# Patient Record
Sex: Male | Born: 1980 | Race: White | Hispanic: No | Smoking: Former smoker
Health system: Southern US, Community
[De-identification: ages and names within clinical notes are randomized; demographics above are authoritative.]

## PROBLEM LIST (undated history)

## (undated) DIAGNOSIS — I10 Essential (primary) hypertension: Secondary | ICD-10-CM

## (undated) DIAGNOSIS — F419 Anxiety disorder, unspecified: Secondary | ICD-10-CM

## (undated) DIAGNOSIS — M25569 Pain in unspecified knee: Secondary | ICD-10-CM

## (undated) DIAGNOSIS — M549 Dorsalgia, unspecified: Secondary | ICD-10-CM

## (undated) HISTORY — PX: KNEE SURGERY: SHX244

## (undated) HISTORY — PX: BACK SURGERY: SHX140

---

## 2020-09-15 ENCOUNTER — Other Ambulatory Visit: Payer: Self-pay

## 2020-09-15 ENCOUNTER — Ambulatory Visit (HOSPITAL_COMMUNITY)
Admission: EM | Admit: 2020-09-15 | Discharge: 2020-09-15 | Disposition: A | Discharge status: Home / Self Care | Payer: BC Managed Care – PPO

## 2020-09-15 ENCOUNTER — Encounter (HOSPITAL_COMMUNITY): Payer: Self-pay | Admitting: Emergency Medicine

## 2020-09-15 DIAGNOSIS — G8929 Other chronic pain: Secondary | ICD-10-CM | POA: Diagnosis not present

## 2020-09-15 DIAGNOSIS — M25562 Pain in left knee: Secondary | ICD-10-CM | POA: Diagnosis not present

## 2020-09-15 HISTORY — DX: Essential (primary) hypertension: I10

## 2020-09-15 HISTORY — DX: Dorsalgia, unspecified: M54.9

## 2020-09-15 HISTORY — DX: Pain in unspecified knee: M25.569

## 2020-09-15 MED ORDER — PREDNISONE 10 MG PO TABS: 20.0000 mg | ORAL_TABLET | Freq: Every day | ORAL | 0 refills | Status: AC

## 2020-09-15 NOTE — Discharge Instructions (Signed)
Take the prednisone as prescribed.  [facility]. Work note given

## 2020-09-15 NOTE — ED Triage Notes (Signed)
Left knee issues for a long time.  Monday was doing a lot of work on a ladder.  Since then has had pain.  No-specific injury.    Work has requested he get seen before returning to work.

## 2020-09-19 NOTE — ED Provider Notes (Signed)
[facility]    CSN: [id number] Arrival date & time: 09/15/20  7106      History   Chief Complaint Chief Complaint  Patient presents with  . Knee Pain    HPI Dillon Gonzalez is a 39 y.o. male.   Patient is a 39 year old presents today with left knee pain.  This is chronic for him.  Has known meniscus issues in that knee and knee surgery.  Monday was doing a lot of work on a ladder and since has had pain.  No specific injury.  Has had mild swelling.  Here for note to return to work.  Has been taping the knee, resting, icing.     Past Medical History:  Diagnosis Date  . Back pain   . Hypertension   . Knee pain     There are no problems to display for this patient.   Past Surgical History:  Procedure Laterality Date  . KNEE SURGERY         Home Medications    Prior to Admission medications   Medication Sig Start Date End Date Taking? Authorizing Provider  cyclobenzaprine (FLEXERIL) 10 MG tablet 1 tablet    [provider]    Family History No family history on file.  Social History Social History   Tobacco Use  . Smoking status: Former Smoker  Substance Use Topics  . Alcohol use: Yes  . Drug use: Never     Allergies   Patient has no known allergies.   Review of Systems Review of Systems   Physical Exam Triage Vital Signs ED Triage Vitals  Enc Vitals Group     BP 09/15/20 1134 (!) 158/95     Pulse Rate 09/15/20 1134 68     Resp 09/15/20 1134 20     Temp 09/15/20 1134 98.8 F (37.1 C)     Temp Source 09/15/20 1134 Oral     SpO2 09/15/20 1134 100 %     Weight --      Height --      Head Circumference --      Peak Flow --      Pain Score 09/15/20 1128 2     Pain Loc --      Pain Edu? --      Excl. in GC? --    No data found.  Updated Vital Signs BP (!) 158/95 (BP Location: Right Arm)   Pulse 68   Temp 98.8 F (37.1 C) (Oral)   Resp 20   SpO2 100%   Visual Acuity Right Eye Distance:   Left Eye Distance:    Bilateral Distance:    Right Eye Near:   Left Eye Near:    Bilateral Near:     Physical Exam Vitals and nursing note reviewed.  Constitutional:      Appearance: Normal appearance.  HENT:     Head: Normocephalic and atraumatic.     Nose: Nose normal.  Eyes:     Conjunctiva/sclera: Conjunctivae normal.  Pulmonary:     Effort: Pulmonary effort is normal.  Musculoskeletal:        General: Normal range of motion.     Cervical back: Normal range of motion.  Skin:    General: Skin is warm and dry.  Neurological:     Mental Status: He is alert.  Psychiatric:        Mood and Affect: Mood normal.      UC Treatments / Results  Labs (all labs ordered  are listed, but only abnormal results are displayed) Labs Reviewed - No data to display  EKG   Radiology No results found.  Procedures Procedures (including critical care time)  Medications Ordered in UC Medications - No data to display  Initial Impression / Assessment and Plan / UC Course  I have reviewed the triage vital signs and the nursing notes.  Pertinent labs & imaging results that were available during my care of the patient were reviewed by me and considered in my medical decision making (see chart for details).     Chronic pain of left knee Patient having improvement of symptoms.  We will do low-dose prednisone x3 days. Note to go back to work [facility]  Final Clinical Impressions(s) / UC Diagnoses   Final diagnoses:  Chronic pain of left knee     Discharge Instructions     Take the prednisone as prescribed.  [facility]. Work note given    ED Prescriptions    Medication Sig Dispense Auth. Provider   predniSONE (DELTASONE) 10 MG tablet Take 2 tablets (20 mg total) by mouth daily for 3 days. 6 tablet Dahlia Byes A, NP     PDMP not reviewed this encounter.   Dahlia Byes A, NP 09/19/20 [phone]

## 2020-10-28 ENCOUNTER — Encounter (HOSPITAL_COMMUNITY): Payer: Self-pay

## 2020-10-28 ENCOUNTER — Ambulatory Visit (HOSPITAL_COMMUNITY)
Admission: EM | Admit: 2020-10-28 | Discharge: 2020-10-28 | Disposition: A | Discharge status: Home / Self Care | Payer: BC Managed Care – PPO | Attending: Family Medicine | Admitting: Family Medicine

## 2020-10-28 DIAGNOSIS — G8929 Other chronic pain: Secondary | ICD-10-CM

## 2020-10-28 DIAGNOSIS — M545 Low back pain, unspecified: Secondary | ICD-10-CM

## 2020-10-28 HISTORY — DX: Anxiety disorder, unspecified: F41.9

## 2020-10-28 MED ORDER — TIZANIDINE HCL 4 MG PO TABS: 4.0000 mg | ORAL_TABLET | Freq: Four times a day (QID) | ORAL | 0 refills | Status: DC | PRN

## 2020-10-28 NOTE — ED Provider Notes (Signed)
[facility]    CSN: [id number] Arrival date & time: 10/28/20  0849      History   Chief Complaint Chief Complaint  Patient presents with  . Back Pain    HPI Dillon Gonzalez is a 39 y.o. male presenting today for evaluation of back pain.  Patient reports that he has chronic lower back pain which she has dealt with since 2006.  Over the past few days his back pain has been flared up more than normal and has been unable to go to work.  Has slightly more burning/warmth sensation to lower back.  Denies urinary symptoms.  Denies weakness in legs.  Has a physical job, but feels his back pain is improved enough to return to work.  HPI  Past Medical History:  Diagnosis Date  . Anxiety   . Back pain   . Hypertension   . Knee pain     There are no problems to display for this patient.   Past Surgical History:  Procedure Laterality Date  . BACK SURGERY    . KNEE SURGERY         Home Medications    Prior to Admission medications   Medication Sig Start Date End Date Taking? Authorizing Provider  cyclobenzaprine (FLEXERIL) 10 MG tablet 1 tablet   Yes [provider]  lisinopril (ZESTRIL) 10 MG tablet Take 10 mg by mouth daily.   Yes [provider]  tiZANidine (ZANAFLEX) 4 MG tablet Take 1 tablet (4 mg total) by mouth every 6 (six) hours as needed for muscle spasms. 10/28/20   Carleena Mires, Junius Creamer, PA-C    Family History History reviewed. No pertinent family history.  Social History Social History   Tobacco Use  . Smoking status: Former Smoker  Substance Use Topics  . Alcohol use: Yes  . Drug use: Never     Allergies   Patient has no known allergies.   Review of Systems Review of Systems  Constitutional: Negative for fatigue and fever.  Eyes: Negative for redness, itching and visual disturbance.  Respiratory: Negative for shortness of breath.   Cardiovascular: Negative for chest pain and leg swelling.  Gastrointestinal: Negative for  nausea and vomiting.  Musculoskeletal: Positive for back pain and myalgias. Negative for arthralgias.  Skin: Negative for color change, rash and wound.  Neurological: Negative for dizziness, syncope, weakness, light-headedness and headaches.     Physical Exam Triage Vital Signs ED Triage Vitals  Enc Vitals Group     BP 10/28/20 1009 (!) 193/107     Pulse Rate 10/28/20 1009 88     Resp 10/28/20 1009 18     Temp 10/28/20 1009 98.6 F (37 C)     Temp Source 10/28/20 1009 Oral     SpO2 10/28/20 1009 98 %     Weight --      Height --      Head Circumference --      Peak Flow --      Pain Score 10/28/20 1005 4     Pain Loc --      Pain Edu? --      Excl. in GC? --    No data found.  Updated Vital Signs BP (!) 193/107 (BP Location: Right Arm)   Pulse 88   Temp 98.6 F (37 C) (Oral)   Resp 18   SpO2 98%   Visual Acuity Right Eye Distance:   Left Eye Distance:   Bilateral Distance:    Right Eye  Near:   Left Eye Near:    Bilateral Near:     Physical Exam Vitals and nursing note reviewed.  Constitutional:      Appearance: He is well-developed.     Comments: No acute distress  HENT:     Head: Normocephalic and atraumatic.     Nose: Nose normal.  Eyes:     Conjunctiva/sclera: Conjunctivae normal.  Cardiovascular:     Rate and Rhythm: Normal rate.  Pulmonary:     Effort: Pulmonary effort is normal. No respiratory distress.  Abdominal:     General: There is no distension.  Musculoskeletal:        General: Normal range of motion.     Cervical back: Neck supple.     Comments: Diffuse tenderness throughout lumbar spine midline and bilateral lumbar musculature extending into upper gluteal areas Strength at hips and knees 5/5 and equal bilaterally; patellar reflex 2+  Skin:    General: Skin is warm and dry.  Neurological:     Mental Status: He is alert and oriented to person, place, and time.      UC Treatments / Results  Labs (all labs ordered are listed, but  only abnormal results are displayed) Labs Reviewed - No data to display  EKG   Radiology No results found.  Procedures Procedures (including critical care time)  Medications Ordered in UC Medications - No data to display  Initial Impression / Assessment and Plan / UC Course  I have reviewed the triage vital signs and the nursing notes.  Pertinent labs & imaging results that were available during my care of the patient were reviewed by me and considered in my medical decision making (see chart for details).    Acute on chronic back pain: Has history of gastritis, prefers to avoid NSAIDs.  Did not like side effect profile from previous prednisone course.  Will stick to muscle relaxers, will provide tizanidine as alternative to Flexeril.  Has plans to follow-up with spine specialist for further treatment.  No red flags, no signs of cauda equina.  Discussed strict return precautions. Patient verbalized understanding and is agreeable with plan.  Final Clinical Impressions(s) / UC Diagnoses   Final diagnoses:  Chronic bilateral low back pain without sciatica     Discharge Instructions     May try tizanidine as alternative Follow up for any concerns    ED Prescriptions    Medication Sig Dispense Auth. Provider   tiZANidine (ZANAFLEX) 4 MG tablet Take 1 tablet (4 mg total) by mouth every 6 (six) hours as needed for muscle spasms. 30 tablet Almarie Kurdziel, [place] C, PA-C     PDMP not reviewed this encounter.   Blakeley Margraf, Jackson C, PA-C 10/28/20 1037

## 2020-10-28 NOTE — ED Triage Notes (Signed)
Pt c/o lumbar back pain and requests work excuse for same stating that he can return to work on October 30. States he was out of work on Tuesday and Wednesday. States pain is better today.   Pt states he has a long h/o back pain.  Reports he had RFA tx for back pain in August. Has been tapering off an SSRI.  Pt states he had CT of back and was told his "colon was inflamed". Pt was able to stretch with left arm out to side to retrieve paper from counter while sitting on exam table.

## 2020-10-28 NOTE — Discharge Instructions (Signed)
May try tizanidine as alternative Follow up for any concerns

## 2021-07-05 ENCOUNTER — Other Ambulatory Visit (HOSPITAL_COMMUNITY): Payer: Self-pay | Admitting: Rehabilitation

## 2021-07-05 DIAGNOSIS — M47819 Spondylosis without myelopathy or radiculopathy, site unspecified: Secondary | ICD-10-CM

## 2021-07-17 ENCOUNTER — Encounter (HOSPITAL_COMMUNITY)
Admission: RE | Admit: 2021-07-17 | Discharge: 2021-07-17 | Disposition: A | Discharge status: Home / Self Care | Payer: No Typology Code available for payment source | Source: Ambulatory Visit | Attending: Rehabilitation | Admitting: Rehabilitation

## 2021-07-17 ENCOUNTER — Other Ambulatory Visit: Payer: Self-pay

## 2021-07-17 ENCOUNTER — Ambulatory Visit (HOSPITAL_COMMUNITY)
Admission: RE | Admit: 2021-07-17 | Discharge: 2021-07-17 | Disposition: A | Discharge status: Home / Self Care | Payer: No Typology Code available for payment source | Source: Ambulatory Visit | Attending: Rehabilitation | Admitting: Rehabilitation

## 2021-07-17 DIAGNOSIS — M47819 Spondylosis without myelopathy or radiculopathy, site unspecified: Secondary | ICD-10-CM | POA: Insufficient documentation

## 2021-07-17 MED ORDER — TECHNETIUM TC 99M MEDRONATE IV KIT
20.0000 | PACK | Freq: Once | INTRAVENOUS | Status: AC | PRN
  Administered 2021-07-17: 19.2 via INTRAVENOUS

## 2022-01-22 DIAGNOSIS — R911 Solitary pulmonary nodule: Secondary | ICD-10-CM | POA: Insufficient documentation

## 2022-01-29 ENCOUNTER — Emergency Department (HOSPITAL_COMMUNITY): Payer: No Typology Code available for payment source

## 2022-01-29 ENCOUNTER — Emergency Department (HOSPITAL_COMMUNITY)
Admission: EM | Admit: 2022-01-29 | Discharge: 2022-01-29 | Disposition: A | Discharge status: Home / Self Care | Payer: No Typology Code available for payment source | Attending: Emergency Medicine | Admitting: Emergency Medicine

## 2022-01-29 ENCOUNTER — Encounter (HOSPITAL_COMMUNITY): Payer: Self-pay | Admitting: Emergency Medicine

## 2022-01-29 ENCOUNTER — Other Ambulatory Visit: Payer: Self-pay

## 2022-01-29 DIAGNOSIS — R569 Unspecified convulsions: Secondary | ICD-10-CM | POA: Diagnosis present

## 2022-01-29 LAB — RAPID URINE DRUG SCREEN, HOSP PERFORMED
Amphetamines: NOT DETECTED
Barbiturates: NOT DETECTED
Benzodiazepines: NOT DETECTED
Cocaine: NOT DETECTED
Opiates: NOT DETECTED
Tetrahydrocannabinol: NOT DETECTED

## 2022-01-29 LAB — BASIC METABOLIC PANEL
Anion gap: 12 (ref 5–15)
BUN: 6 mg/dL (ref 6–20)
CO2: 22 mmol/L (ref 22–32)
Calcium: 8.6 mg/dL — ABNORMAL LOW (ref 8.9–10.3)
Chloride: 99 mmol/L (ref 98–111)
Creatinine, Ser: 0.8 mg/dL (ref 0.61–1.24)
GFR, Estimated: >60 mL/min (ref >60)
Glucose, Bld: 105 mg/dL — ABNORMAL HIGH (ref 70–99)
Potassium: 3.6 mmol/L (ref 3.5–5.1)
Sodium: 133 mmol/L — ABNORMAL LOW (ref 135–145)

## 2022-01-29 LAB — CBC
HCT: 38.4 % — ABNORMAL LOW (ref 39.0–52.0)
Hemoglobin: 13.3 g/dL (ref 13.0–17.0)
MCH: 33.8 pg (ref 26.0–34.0)
MCHC: 34.6 g/dL (ref 30.0–36.0)
MCV: 97.7 fL (ref 80.0–100.0)
Platelets: 142 10*3/uL — ABNORMAL LOW (ref 150–400)
RBC: 3.93 MIL/uL — ABNORMAL LOW (ref 4.22–5.81)
RDW: 12.3 % (ref 11.5–15.5)
WBC: 6.2 10*3/uL (ref 4.0–10.5)
nRBC: 0.3 % — ABNORMAL HIGH (ref 0.0–0.2)

## 2022-01-29 LAB — ETHANOL: Alcohol, Ethyl (B): <10 mg/dL (ref <10)

## 2022-01-29 MED ORDER — LORAZEPAM 1 MG PO TABS: 1.0000 mg | ORAL_TABLET | Freq: Three times a day (TID) | ORAL | 0 refills | Status: DC | PRN

## 2022-01-29 MED ORDER — LORAZEPAM 1 MG PO TABS
1.0000 mg | ORAL_TABLET | Freq: Once | ORAL | Status: AC
  Administered 2022-01-29: 1 mg via ORAL
  Filled 2022-01-29: qty 1

## 2022-01-29 NOTE — ED Notes (Signed)
Patient transported to CT 

## 2022-01-29 NOTE — Discharge Instructions (Addendum)
Called one of the neurology offices for the new seizure.  Do not drive until they clear you.  Or you can follow with the [place]
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
## 2022-01-29 NOTE — ED Triage Notes (Signed)
Pt here after witnessed seizure activity x 2 today. Friend reports pt was agitated and confused initially after event, pt bit his tongue, no hx seizure. Pt reports he drinks regularly, last drink last night.
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
## 2022-02-16 ENCOUNTER — Ambulatory Visit: Payer: No Typology Code available for payment source | Admitting: Neurology

## 2022-02-16 DIAGNOSIS — Z029 Encounter for administrative examinations, unspecified: Secondary | ICD-10-CM

## 2022-02-19 ENCOUNTER — Encounter: Payer: Self-pay | Admitting: Neurology

## 2022-02-21 ENCOUNTER — Encounter: Payer: Self-pay | Admitting: Neurology

## 2022-02-21 ENCOUNTER — Ambulatory Visit (INDEPENDENT_AMBULATORY_CARE_PROVIDER_SITE_OTHER): Payer: No Typology Code available for payment source | Admitting: Neurology

## 2022-02-21 ENCOUNTER — Other Ambulatory Visit: Payer: Self-pay

## 2022-02-21 VITALS — BP 158/93 | HR 96 | Ht 75.0 in | Wt 198.8 lb

## 2022-02-21 DIAGNOSIS — R569 Unspecified convulsions: Secondary | ICD-10-CM | POA: Diagnosis not present
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
## 2022-02-21 NOTE — Progress Notes (Signed)
NEUROLOGY CONSULTATION NOTE  Dillon Gonzalez MRN: [id number] DOB: May 10, 1981  Referring provider: Dr. Charlyne Petrin Primary care provider: Dr. Charlyne Petrin  Reason for consult:  new onset seizure   Thank you for your kind referral of Dillon Gonzalez for consultation of the above symptoms. Although his history is well known to you, please allow me to reiterate it for the purpose of our medical record. He is alone in the office today. Records and images were personally reviewed where available.   HISTORY OF PRESENT ILLNESS: This is a 41 year old right-handed man with a history of hypertension, anxiety, PTSD, presenting for evaluation of new onset seizure that occurred on 01/29/2022. He was a passenger in the car and recalls looking through his phone then feeling like his hands stopped working. He recalls repeatedly saying "no," then woke up with EMS around him. His calves felt like he ran 3 miles and he felt maybe a little weaker on the left side. His friend reported that his arms began shaking, then he slumped down and was unresponsive for several minutes followed by another bout of shaking, gasping, "spitting everywhere." He had bitten his tongue. He was brought to the ER where CBC, BMP were unremarkable, negative UDS and EtOH level. I personally reviewed head CT without contrast which was normal.   He denies any prior history of convulsions but would occasionally have gaps in time. He does not remember eating something or how he got to the bedroom. He denies being told of any staring episodes. He lives alone, his friend stays sometimes and has told him that "really weird stuff happen in sleep." He has OSA and uses a CPAP machine, he would stop breathing, gasp an shake a little bit. There was an incident in March 2022 where he got out of bed, took 2 steps and fell, went to the kitchen and fell again. He wonders if this is due to medication, he was dizzy but awake enough and denies losing  consciousness.He occasionally notices a metallic taste. His left side has been weaker for a long time from a back injury causing nerve damage. Since the beginning of January, he has noticed more numbness in his feet, he can walk but can barely feel anything. This did improve after he stopped Telmisartan but there is still tingling L>R. He has sleep difficulties and was a little sleep deprived prior to the seizure. He started Trazodone 3 night ago and now gets 4-5 hours of sleep. He has Tramadol for back pain but reports last intake prior to the seizure was 3 weeks before. He denies daily alcohol use. His last LFTs on 01/10/22 were AST 134, ALT 237. He is currently unemployed. He feels his memory is good. He had a normal birth and early development.  There is no history of febrile convulsions, CNS infections such as meningitis/encephalitis, significant traumatic brain injury, neurosurgical procedures, or family history of seizures.    PAST MEDICAL HISTORY: Past Medical History:  Diagnosis Date   Anxiety    Back pain    Hypertension    Knee pain     PAST SURGICAL HISTORY: Past Surgical History:  Procedure Laterality Date   BACK SURGERY     KNEE SURGERY      MEDICATIONS: Current Outpatient Medications on File Prior to Visit  Medication Sig Dispense Refill   celecoxib (CELEBREX) 200 MG capsule 1 capsule with food Orally Once a day for 30 day(s)     Cholecalciferol 25 MCG (1000 UT) tablet  Take by mouth.     cyclobenzaprine (FLEXERIL) 10 MG tablet Takes half tablet     LORazepam (ATIVAN) 1 MG tablet Take 1 tablet (1 mg total) by mouth every 8 (eight) hours as needed for anxiety or seizure. 5 tablet 0   omeprazole (PRILOSEC) 20 MG capsule 1 capsule 30 minutes before morning meal Orally Once a day for 30 day(s)     traZODone (DESYREL) 50 MG tablet Take by mouth.     tiZANidine (ZANAFLEX) 4 MG tablet Take 1 tablet (4 mg total) by mouth every 6 (six) hours as needed for muscle spasms. (Patient not  taking: Reported on 02/21/2022) 30 tablet 0   No current facility-administered medications on file prior to visit.    ALLERGIES: Allergies  Allergen Reactions   Amlodipine     Other reaction(s): Lethargy (intolerance)   Lisinopril Nausea And Vomiting   Losartan    Paroxetine Diarrhea   Sertraline Nausea And Vomiting   Telmisartan Swelling    FAMILY HISTORY: History reviewed. No pertinent family history.  SOCIAL HISTORY: Social History   Socioeconomic History   Marital status: Single    Spouse name: Not on file   Number of children: Not on file   Years of education: Not on file   Highest education level: Not on file  Occupational History   Not on file  Tobacco Use   Smoking status: Former   Smokeless tobacco: Not on file  Vaping Use   Vaping Use: Former  Substance and Sexual Activity   Alcohol use: Yes   Drug use: Never   Sexual activity: Not on file  Other Topics Concern   Not on file  Social History Narrative   Right handed    Social Determinants of Health   Financial Resource Strain: Not on file  Food Insecurity: Not on file  Transportation Needs: Not on file  Physical Activity: Not on file  Stress: Not on file  Social Connections: Not on file  Intimate Partner Violence: Not on file     PHYSICAL EXAM: Vitals:   02/21/22 1256  BP: (!) 158/93  Pulse: 96  SpO2: 98%   General: No acute distress Head:  Normocephalic/atraumatic Skin/Extremities: No rash, no edema Neurological Exam: Mental status: alert and oriented to person, place, and time, no dysarthria or aphasia, Fund of knowledge is appropriate.  Recent and remote memory are intact, 3/3 delayed recall.  Attention and concentration are normal, 5/5 WORLD backwards.  Cranial nerves: CN I: not tested CN II: pupils equal, round and reactive to light, visual fields intact CN III, IV, VI:  full range of motion, no nystagmus, no ptosis CN V: facial sensation intact CN VII: upper and lower face  symmetric CN VIII: hearing intact to conversation Bulk & Tone: normal, no fasciculations. Motor: 5/5 throughout with no pronator drift. Sensation: intact to light touch, cold, pin, vibration and joint position sense.  No extinction to double simultaneous stimulation.  Romberg test negative Deep Tendon Reflexes: +1 on both LE, brisk +2 on both LE. Negative Hoffman sign, no ankle clonus Cerebellar: no incoordination on finger to nose testing Gait: narrow-based and steady, able to tandem walk adequately. Tremor: no resting tremor. He has bilateral postural and endpoint hand tremors   IMPRESSION: This is a 41 year old right-handed man with a history of hypertension, anxiety, PTSD, presenting for evaluation of new onset seizure that occurred on 01/29/2022. His neurological exam is normal. We discussed that after an initial seizure, unless there are significant  risk factors, an abnormal neurological exam, an EEG showing epileptiform abnormalities, and/or abnormal neuroimaging, treatment with an antiepileptic drug is not indicated. MRI brain with and without contrast and a 1-hour EEG will be ordered. We discussed 10% of the population may have a single seizure. Patients with a single unprovoked seizure have a recurrence rate of 33%  after a single seizure and 73% after a second seizure. He was advised to continue with reduction in alcohol intake. We discussed [place] driving restrictions which indicate a patient needs to free of seizures or events of altered awareness for 6 months prior to resuming driving. The patient agreed to comply with these restrictions.  Follow-up in 3 months,call for any changes.    Thank you for allowing me to participate in the care of this patient. Please do not hesitate to call for any questions or concerns.   Ellouise Newer, M.D.  CC: Dr. Domenica Fail

## 2022-03-06 ENCOUNTER — Ambulatory Visit: Payer: No Typology Code available for payment source | Admitting: Neurology

## 2022-03-06 ENCOUNTER — Other Ambulatory Visit: Payer: Self-pay

## 2022-03-06 DIAGNOSIS — R569 Unspecified convulsions: Secondary | ICD-10-CM

## 2022-03-06 NOTE — Progress Notes (Signed)
Earlier events noted. Patient strongly advised ER evaluation which he declined and left against medical advise. We will reschedule EEG if he wishes.  ?

## 2022-03-06 NOTE — Progress Notes (Signed)
Pt came in today to get an EEG and started having active chest pain, this nurse Herbert Seta was called to check his VS his BP 170/110 heart rate 98 oxygen 98%. Pt complaint of chest pain, headache, and pain moving to arm and all over uncomfortable pain. Pt was advised that we needed to call 911 he became agitated and stated he wanted to do his test and leave. Pt was moving around in the bed with signs of discomfort and still refusing to go the ER [facility]. Pt stated he took an uber here and did not have a driver. Pt told EEG tech that he did drive pt is a seizure pt and should not be driving because he is not 6 months seizure free,  When asking pt about his pain he started yelling unhook me I want to leave. Darl Pikes EEG tech unhooked the pt, Dr Karel Jarvis was informed of pt VS and how he was acting. Pt was unhooked from the EEG testing and walked out of the office still refusing his care,  ?

## 2022-03-13 ENCOUNTER — Other Ambulatory Visit: Payer: Self-pay

## 2022-03-13 ENCOUNTER — Ambulatory Visit
Admission: RE | Admit: 2022-03-13 | Discharge: 2022-03-13 | Disposition: A | Discharge status: Home / Self Care | Payer: No Typology Code available for payment source | Source: Ambulatory Visit | Attending: Neurology | Admitting: Neurology

## 2022-03-13 DIAGNOSIS — R569 Unspecified convulsions: Secondary | ICD-10-CM

## 2022-03-13 MED ORDER — GADOBENATE DIMEGLUMINE 529 MG/ML IV SOLN
18.0000 mL | Freq: Once | INTRAVENOUS | Status: AC | PRN
  Administered 2022-03-13: 18 mL via INTRAVENOUS

## 2022-03-16 ENCOUNTER — Telehealth: Payer: Self-pay

## 2022-03-16 NOTE — Telephone Encounter (Signed)
Pt called per DPR left a voice mail that brain MRI is normal, no tumor, stroke, or bleed. When he is ready for EEG and would like [place] to do it, can r/s with front staff. Office to the number was left if pt has any questions or concerns  ?

## 2022-03-16 NOTE — Telephone Encounter (Signed)
-----   Message from Van Clines, MD sent at 03/15/2022 11:15 PM EDT ----- ?Pls let him know brain MRI is normal, no tumor, stroke, or bleed. When he is ready for EEG and would like [place] to do it, can r/s with front staff.  ?

## 2022-03-29 ENCOUNTER — Ambulatory Visit (INDEPENDENT_AMBULATORY_CARE_PROVIDER_SITE_OTHER): Payer: No Typology Code available for payment source | Admitting: Neurology

## 2022-03-29 DIAGNOSIS — R569 Unspecified convulsions: Secondary | ICD-10-CM

## 2022-04-02 ENCOUNTER — Other Ambulatory Visit: Payer: No Typology Code available for payment source

## 2022-04-03 ENCOUNTER — Telehealth: Payer: Self-pay

## 2022-04-03 NOTE — Telephone Encounter (Signed)
Pt called no answer left a voice mail that eeg was normal  ?

## 2022-04-03 NOTE — Procedures (Signed)
ELECTROENCEPHALOGRAM REPORT ? ?Date of Study: 03/29/2022 ? ?Patient's Name: Dillon Gonzalez ?MRN: [id number] ?Date of Birth: 04/11/1981 ? ?Referring Provider: Dr. Ellouise Newer ? ?Clinical History: This is a 41 year old man with new onset seizure in January 2023. EEG for classification. ? ?Medications: ?CELEBREX 200 MG capsule ?Cholecalciferol 25 MCG (1000 UT) tablet ?FLEXERIL 10 MG tablet ?ATIVAN 1 MG tablet ?PRILOSEC 20 MG capsule ?DESYREL 50 MG tablet ?ZANAFLEX 4MG  tablet ? ?Technical Summary: ?A multichannel digital 1-hour EEG recording measured by the international 10-20 system with electrodes applied with paste and impedances below 5000 ohms performed in our laboratory with EKG monitoring in an awake and asleep patient.  Hyperventilation and photic stimulation was performed.  The digital EEG was referentially recorded, reformatted, and digitally filtered in a variety of bipolar and referential montages for optimal display.   ? ?Description: ?The patient is awake and drowsy during the recording.  During maximal wakefulness, there is a symmetric, medium voltage 10 Hz posterior dominant rhythm that attenuates with eye opening.  The record is symmetric.  During drowsiness, there is an increase in theta slowing of the background.  Sleep was not captured. Hyperventilation and photic stimulation did not elicit any abnormalities.  There were no epileptiform discharges or electrographic seizures seen.   ? ?EKG lead was unremarkable. ? ?Impression: ?This 1-hour awake and drowsy EEG is normal.   ? ?Clinical Correlation: ?A normal EEG does not exclude a clinical diagnosis of epilepsy.  If further clinical questions remain, prolonged EEG may be helpful.  Clinical correlation is advised. ? ? ?Ellouise Newer, M.D. ? ?

## 2022-04-03 NOTE — Telephone Encounter (Signed)
-----   Message from Van Clines, MD sent at 04/03/2022  1:49 PM EDT ----- ?Pls let him know EEG was normal, thanks ?

## 2022-04-05 ENCOUNTER — Other Ambulatory Visit: Payer: No Typology Code available for payment source

## 2022-05-25 ENCOUNTER — Ambulatory Visit: Payer: No Typology Code available for payment source | Admitting: Neurology

## 2023-04-13 IMAGING — CT CT HEAD W/O CM
4 series · 17 of 47 positions shown, 19 images · non-contrast
Comparison: None.

CLINICAL DATA: New onset seizure activity



[Series 3: head without · axial · non-contrast · 0.42mm/px · z∈[+130,+260]mm · 7 of 36 slices shown, 9 images]
[im 5/36  brain]
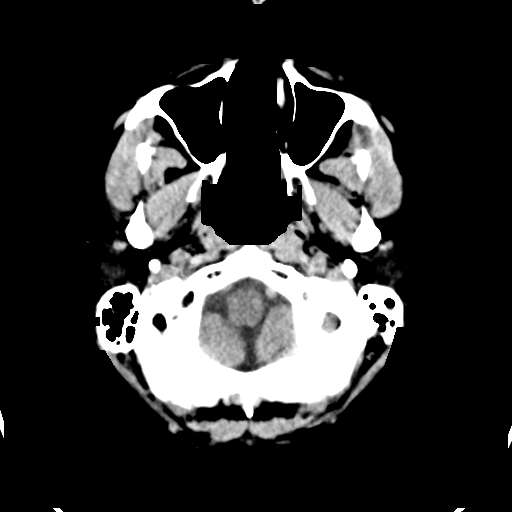
[im 5/36  bone]
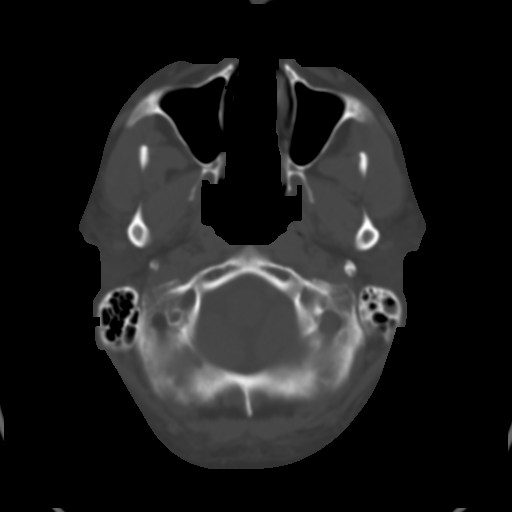
[im 9/36  brain]
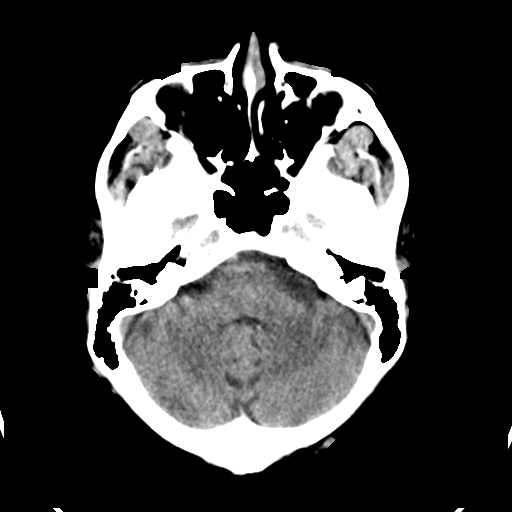
[im 14/36  brain]
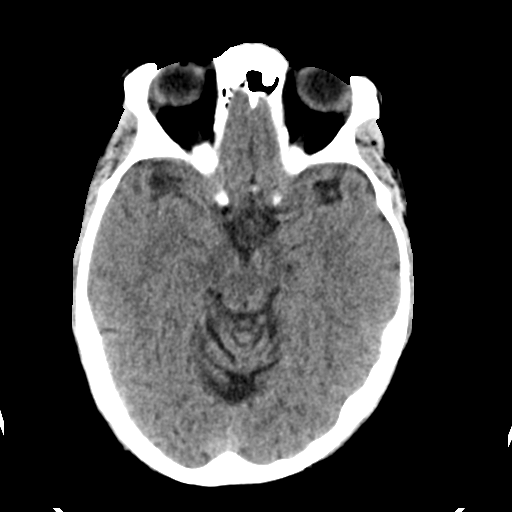
[im 18/36  brain]
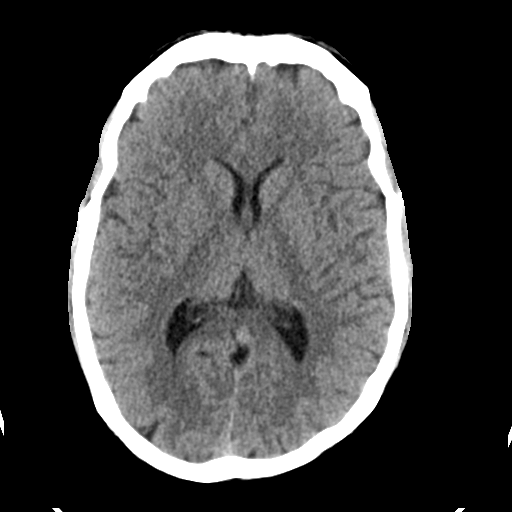
[im 22/36  brain]
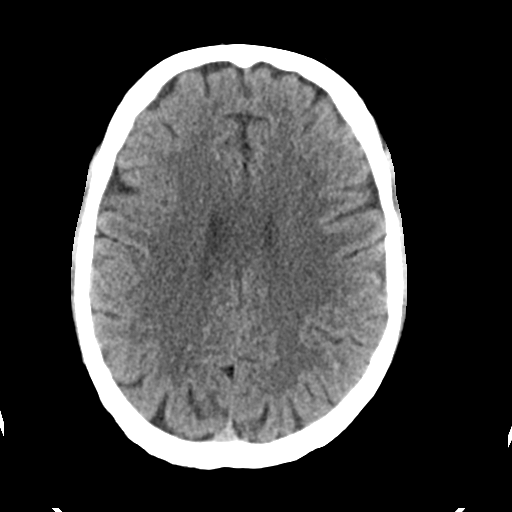
[im 22/36  bone]
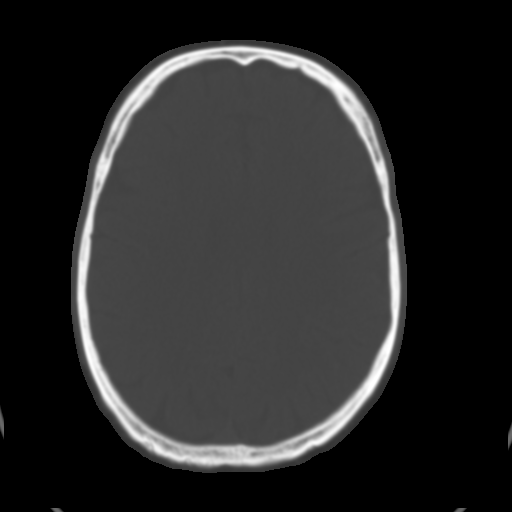
[im 27/36  brain]
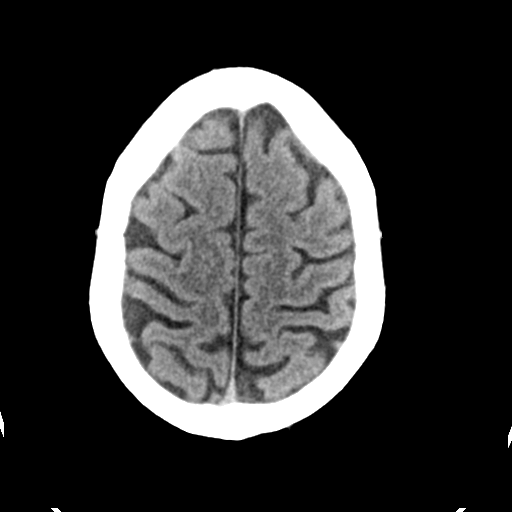
[im 31/36  brain]
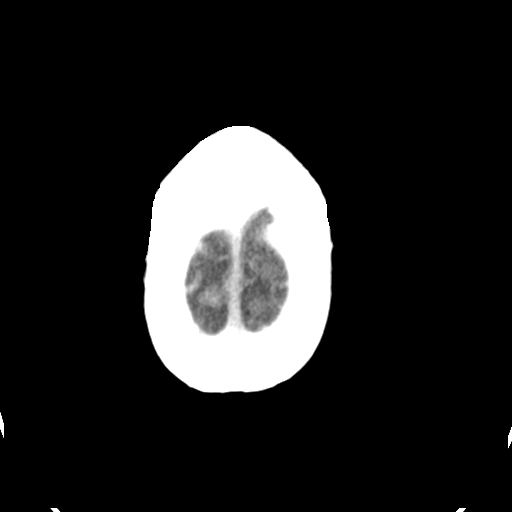

[Series 4: head bone · axial · 0.42mm/px · z∈[+126,+188]mm · 4 of 88 slices shown]
[im 9/88  bone]
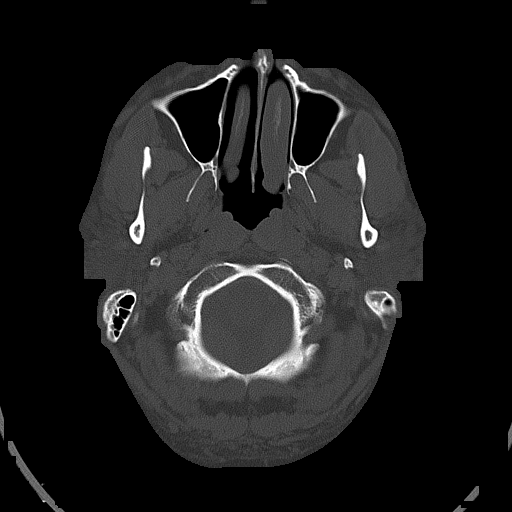
[im 18/88  bone]
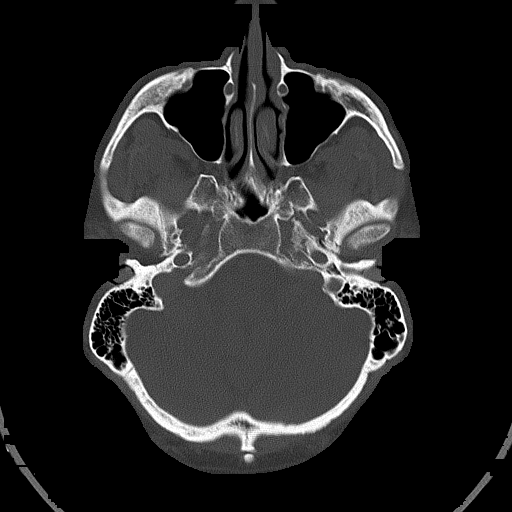
[im 27/88  bone]
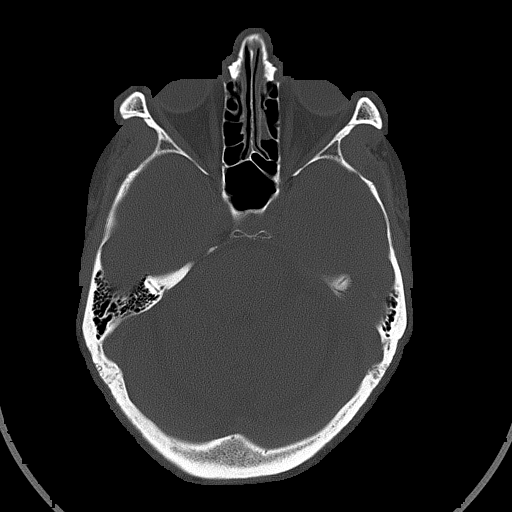
[im 40/88  bone]
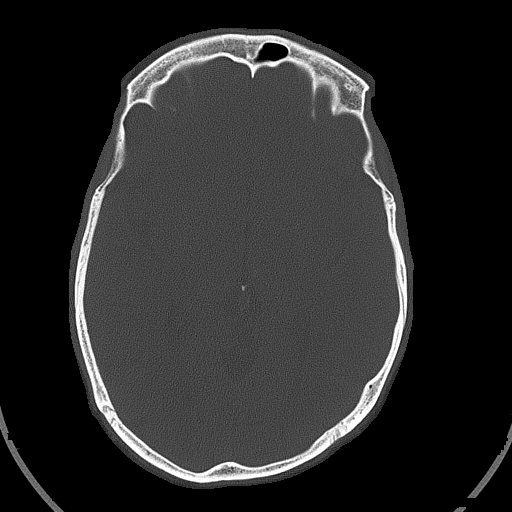

[Series 5: head without cor · coronal · non-contrast · 0.32mm/px · 3 of 69 slices shown]
[im 23/69  brain]
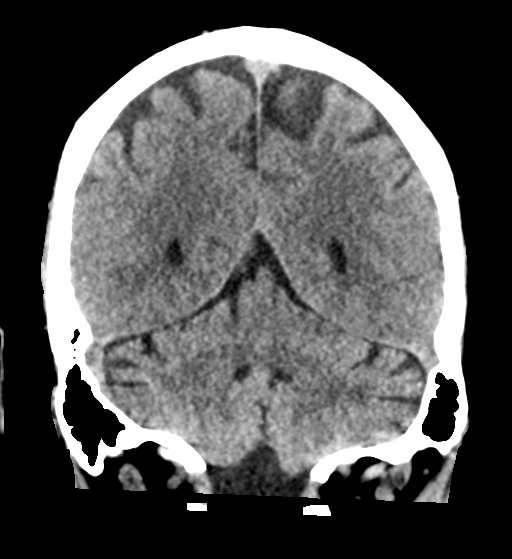
[im 31/69  brain]
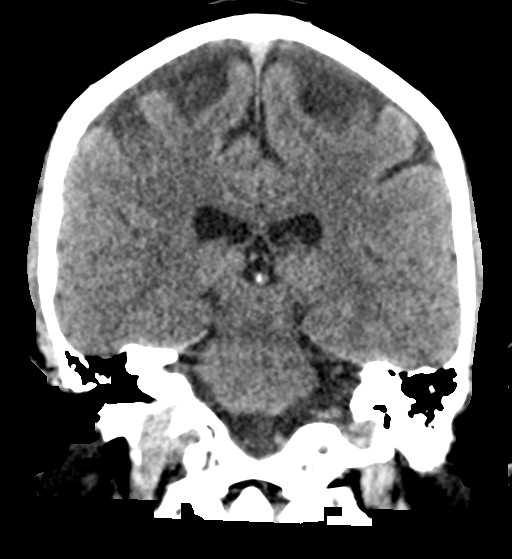
[im 38/69  brain]
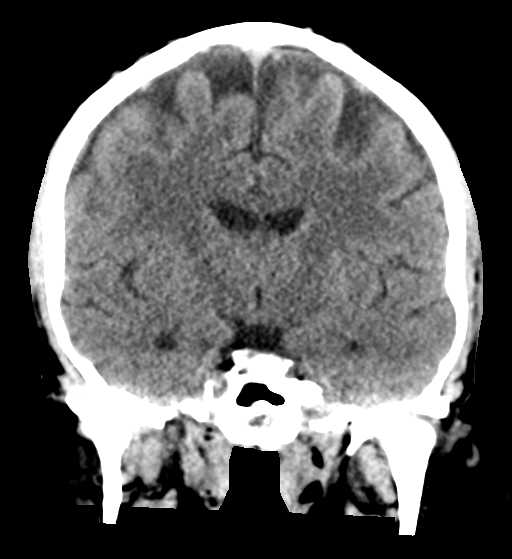

[Series 6: head without sag · sagittal · non-contrast · 0.35mm/px · 3 of 56 slices shown]
[im 19/56  brain]
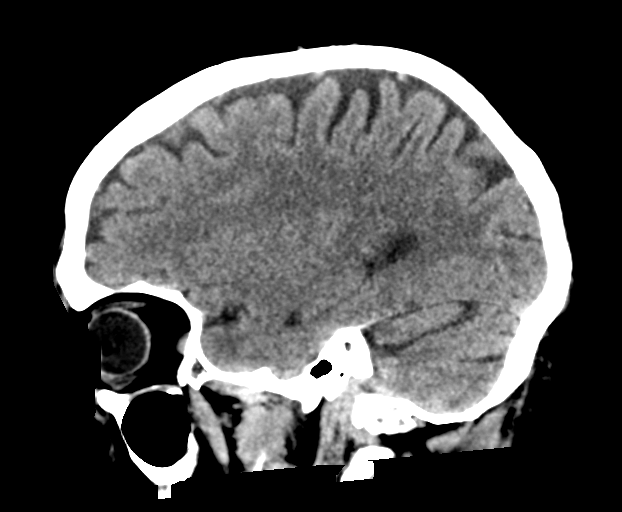
[im 28/56  brain]
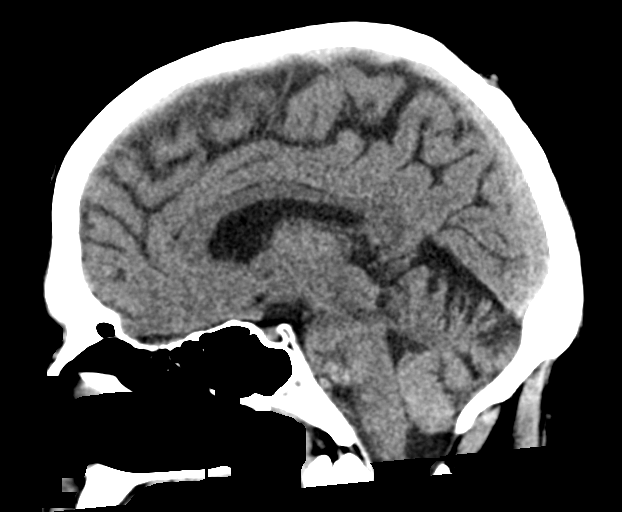
[im 37/56  brain]
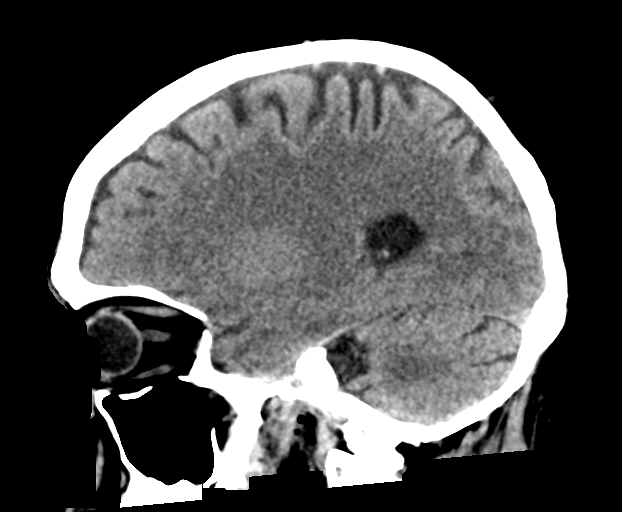

[17 of 47 positions shown; findings below may reference images not displayed]

FINDINGS: Brain: No evidence of acute infarction, hemorrhage, hydrocephalus,
extra-axial collection or mass lesion/mass effect.

Vascular: No hyperdense vessel or unexpected calcification.

Skull: Normal. Negative for fracture or focal lesion.

Sinuses/Orbits: No acute finding.

Other: None.
IMPRESSION: No acute intracranial abnormality noted.

## 2023-05-14 ENCOUNTER — Emergency Department (HOSPITAL_COMMUNITY): Payer: No Typology Code available for payment source

## 2023-05-14 ENCOUNTER — Inpatient Hospital Stay (HOSPITAL_COMMUNITY)
Admission: EM | Admit: 2023-05-14 | Discharge: 2023-05-17 | DRG: 083 | Disposition: A | Discharge status: Home / Self Care | Payer: No Typology Code available for payment source | Attending: Internal Medicine | Admitting: Internal Medicine

## 2023-05-14 ENCOUNTER — Other Ambulatory Visit: Payer: Self-pay

## 2023-05-14 DIAGNOSIS — G40509 Epileptic seizures related to external causes, not intractable, without status epilepticus: Secondary | ICD-10-CM | POA: Diagnosis present

## 2023-05-14 DIAGNOSIS — F419 Anxiety disorder, unspecified: Secondary | ICD-10-CM | POA: Diagnosis present

## 2023-05-14 DIAGNOSIS — R945 Abnormal results of liver function studies: Secondary | ICD-10-CM | POA: Diagnosis present

## 2023-05-14 DIAGNOSIS — I10 Essential (primary) hypertension: Secondary | ICD-10-CM | POA: Diagnosis present

## 2023-05-14 DIAGNOSIS — F10139 Alcohol abuse with withdrawal, unspecified: Secondary | ICD-10-CM | POA: Diagnosis present

## 2023-05-14 DIAGNOSIS — R0602 Shortness of breath: Secondary | ICD-10-CM | POA: Diagnosis present

## 2023-05-14 DIAGNOSIS — E876 Hypokalemia: Secondary | ICD-10-CM | POA: Diagnosis present

## 2023-05-14 DIAGNOSIS — G4733 Obstructive sleep apnea (adult) (pediatric): Secondary | ICD-10-CM | POA: Diagnosis present

## 2023-05-14 DIAGNOSIS — W19XXXA Unspecified fall, initial encounter: Secondary | ICD-10-CM | POA: Diagnosis present

## 2023-05-14 DIAGNOSIS — K219 Gastro-esophageal reflux disease without esophagitis: Secondary | ICD-10-CM | POA: Diagnosis present

## 2023-05-14 DIAGNOSIS — S065XAA Traumatic subdural hemorrhage with loss of consciousness status unknown, initial encounter: Principal | ICD-10-CM | POA: Diagnosis present

## 2023-05-14 DIAGNOSIS — F10939 Alcohol use, unspecified with withdrawal, unspecified: Secondary | ICD-10-CM | POA: Diagnosis present

## 2023-05-14 DIAGNOSIS — R9431 Abnormal electrocardiogram [ECG] [EKG]: Secondary | ICD-10-CM | POA: Diagnosis present

## 2023-05-14 DIAGNOSIS — F4312 Post-traumatic stress disorder, chronic: Secondary | ICD-10-CM | POA: Diagnosis present

## 2023-05-14 DIAGNOSIS — F17201 Nicotine dependence, unspecified, in remission: Secondary | ICD-10-CM | POA: Insufficient documentation

## 2023-05-14 DIAGNOSIS — G4089 Other seizures: Secondary | ICD-10-CM | POA: Diagnosis present

## 2023-05-14 DIAGNOSIS — Z87891 Personal history of nicotine dependence: Secondary | ICD-10-CM

## 2023-05-14 DIAGNOSIS — R569 Unspecified convulsions: Secondary | ICD-10-CM

## 2023-05-14 DIAGNOSIS — S0001XA Abrasion of scalp, initial encounter: Secondary | ICD-10-CM | POA: Diagnosis present

## 2023-05-14 DIAGNOSIS — F4320 Adjustment disorder, unspecified: Secondary | ICD-10-CM | POA: Insufficient documentation

## 2023-05-14 LAB — CBC WITH DIFFERENTIAL/PLATELET
Abs Immature Granulocytes: 0.26 10*3/uL — ABNORMAL HIGH (ref 0.00–0.07)
Basophils Absolute: 0.1 10*3/uL (ref 0.0–0.1)
Basophils Relative: 1 %
Eosinophils Absolute: 0 10*3/uL (ref 0.0–0.5)
Eosinophils Relative: 1 %
HCT: 40 % (ref 39.0–52.0)
Hemoglobin: 14.6 g/dL (ref 13.0–17.0)
Immature Granulocytes: 3 %
Lymphocytes Relative: 11 %
Lymphs Abs: 0.9 10*3/uL (ref 0.7–4.0)
MCH: 34 pg (ref 26.0–34.0)
MCHC: 36.5 g/dL — ABNORMAL HIGH (ref 30.0–36.0)
MCV: 93.2 fL (ref 80.0–100.0)
Monocytes Absolute: 0.9 10*3/uL (ref 0.1–1.0)
Monocytes Relative: 11 %
Neutro Abs: 6.2 10*3/uL (ref 1.7–7.7)
Neutrophils Relative %: 73 %
Platelets: 182 10*3/uL (ref 150–400)
RBC: 4.29 MIL/uL (ref 4.22–5.81)
RDW: 11.6 % (ref 11.5–15.5)
WBC: 8.5 10*3/uL (ref 4.0–10.5)
nRBC: 0 % (ref 0.0–0.2)

## 2023-05-14 LAB — COMPREHENSIVE METABOLIC PANEL
ALT: 74 U/L — ABNORMAL HIGH (ref 0–44)
AST: 129 U/L — ABNORMAL HIGH (ref 15–41)
Albumin: 4.2 g/dL (ref 3.5–5.0)
Alkaline Phosphatase: 87 U/L (ref 38–126)
Anion gap: 16 — ABNORMAL HIGH (ref 5–15)
BUN: 12 mg/dL (ref 6–20)
CO2: 26 mmol/L (ref 22–32)
Calcium: 8 mg/dL — ABNORMAL LOW (ref 8.9–10.3)
Chloride: 90 mmol/L — ABNORMAL LOW (ref 98–111)
Creatinine, Ser: 1.13 mg/dL (ref 0.61–1.24)
GFR, Estimated: >60 mL/min (ref >60)
Glucose, Bld: 111 mg/dL — ABNORMAL HIGH (ref 70–99)
Potassium: 2.4 mmol/L — CL (ref 3.5–5.1)
Sodium: 132 mmol/L — ABNORMAL LOW (ref 135–145)
Total Bilirubin: 2.3 mg/dL — ABNORMAL HIGH (ref 0.3–1.2)
Total Protein: 8.1 g/dL (ref 6.5–8.1)

## 2023-05-14 LAB — CBG MONITORING, ED: Glucose-Capillary: 117 mg/dL — ABNORMAL HIGH (ref 70–99)

## 2023-05-14 LAB — ETHANOL: Alcohol, Ethyl (B): <10 mg/dL (ref <10)

## 2023-05-14 LAB — MAGNESIUM: Magnesium: 1.3 mg/dL — ABNORMAL LOW (ref 1.7–2.4)

## 2023-05-14 MED ORDER — ATENOLOL 50 MG PO TABS
50.0000 mg | ORAL_TABLET | Freq: Every day | ORAL | Status: DC
  Administered 2023-05-15 – 2023-05-17 (×3): 50 mg via ORAL
  Filled 2023-05-14 (×3): qty 1

## 2023-05-14 MED ORDER — PANTOPRAZOLE SODIUM 40 MG PO TBEC
40.0000 mg | DELAYED_RELEASE_TABLET | Freq: Every day | ORAL | Status: DC
  Administered 2023-05-14 – 2023-05-17 (×4): 40 mg via ORAL
  Filled 2023-05-14 (×4): qty 1

## 2023-05-14 MED ORDER — LORAZEPAM 1 MG PO TABS: 1.0000 mg | ORAL_TABLET | ORAL | Status: DC | PRN

## 2023-05-14 MED ORDER — POTASSIUM CHLORIDE 10 MEQ/100ML IV SOLN
10.0000 meq | INTRAVENOUS | Status: AC
  Administered 2023-05-14 – 2023-05-15 (×4): 10 meq via INTRAVENOUS
  Filled 2023-05-14 (×4): qty 100

## 2023-05-14 MED ORDER — POTASSIUM CHLORIDE CRYS ER 20 MEQ PO TBCR
40.0000 meq | EXTENDED_RELEASE_TABLET | Freq: Once | ORAL | Status: AC
  Administered 2023-05-14: 40 meq via ORAL
  Filled 2023-05-14: qty 2

## 2023-05-14 MED ORDER — CHLORHEXIDINE GLUCONATE 0.12% ORAL RINSE (MEDLINE KIT): 15.0000 mL | Freq: Two times a day (BID) | OROMUCOSAL | Status: DC

## 2023-05-14 MED ORDER — IBUPROFEN 200 MG PO TABS: 400.0000 mg | ORAL_TABLET | Freq: Four times a day (QID) | ORAL | Status: DC | PRN

## 2023-05-14 MED ORDER — TRAZODONE HCL 50 MG PO TABS
50.0000 mg | ORAL_TABLET | Freq: Every evening | ORAL | Status: DC | PRN
  Administered 2023-05-14 – 2023-05-16 (×3): 50 mg via ORAL
  Filled 2023-05-14 (×3): qty 1

## 2023-05-14 MED ORDER — BUSPIRONE HCL 5 MG PO TABS
5.0000 mg | ORAL_TABLET | Freq: Two times a day (BID) | ORAL | Status: DC | PRN
  Administered 2023-05-14 – 2023-05-16 (×3): 5 mg via ORAL
  Filled 2023-05-14 (×3): qty 1

## 2023-05-14 MED ORDER — ADULT MULTIVITAMIN W/MINERALS CH
1.0000 | ORAL_TABLET | Freq: Every day | ORAL | Status: DC
  Administered 2023-05-15 – 2023-05-17 (×3): 1 via ORAL
  Filled 2023-05-14 (×3): qty 1

## 2023-05-14 MED ORDER — THIAMINE MONONITRATE 100 MG PO TABS
100.0000 mg | ORAL_TABLET | Freq: Every day | ORAL | Status: DC
  Administered 2023-05-15 – 2023-05-17 (×3): 100 mg via ORAL
  Filled 2023-05-14 (×3): qty 1

## 2023-05-14 MED ORDER — FOLIC ACID 1 MG PO TABS
1.0000 mg | ORAL_TABLET | Freq: Every day | ORAL | Status: DC
  Administered 2023-05-15 – 2023-05-17 (×3): 1 mg via ORAL
  Filled 2023-05-14 (×3): qty 1

## 2023-05-14 MED ORDER — LEVETIRACETAM IN NACL 500 MG/100ML IV SOLN
500.0000 mg | Freq: Once | INTRAVENOUS | Status: AC
  Administered 2023-05-14: 500 mg via INTRAVENOUS
  Filled 2023-05-14: qty 100

## 2023-05-14 MED ORDER — THIAMINE HCL 100 MG/ML IJ SOLN: 100.0000 mg | Freq: Every day | INTRAMUSCULAR | Status: DC

## 2023-05-14 MED ORDER — MAGNESIUM SULFATE 2 GM/50ML IV SOLN
2.0000 g | Freq: Once | INTRAVENOUS | Status: AC
  Administered 2023-05-14: 2 g via INTRAVENOUS
  Filled 2023-05-14: qty 50

## 2023-05-14 MED ORDER — LACTATED RINGERS IV SOLN: INTRAVENOUS | Status: DC

## 2023-05-14 MED ORDER — ORAL CARE MOUTH RINSE
15.0000 mL | OROMUCOSAL | Status: DC
  Administered 2023-05-15 – 2023-05-16 (×7): 15 mL via OROMUCOSAL

## 2023-05-14 MED ORDER — AMLODIPINE BESYLATE 5 MG PO TABS
5.0000 mg | ORAL_TABLET | Freq: Every day | ORAL | Status: DC
  Administered 2023-05-15 – 2023-05-17 (×3): 5 mg via ORAL
  Filled 2023-05-14 (×3): qty 1

## 2023-05-14 MED ORDER — CALCIUM GLUCONATE-NACL 1-0.675 GM/50ML-% IV SOLN
1.0000 g | Freq: Once | INTRAVENOUS | Status: AC
  Administered 2023-05-14: 1000 mg via INTRAVENOUS
  Filled 2023-05-14: qty 50

## 2023-05-14 MED ORDER — POLYETHYLENE GLYCOL 3350 17 G PO PACK: 17.0000 g | PACK | Freq: Every day | ORAL | Status: DC | PRN

## 2023-05-14 MED ORDER — METOPROLOL TARTRATE 5 MG/5ML IV SOLN: 5.0000 mg | Freq: Four times a day (QID) | INTRAVENOUS | Status: DC | PRN

## 2023-05-14 MED ORDER — FENTANYL CITRATE PF 50 MCG/ML IJ SOSY: 12.5000 ug | PREFILLED_SYRINGE | INTRAMUSCULAR | Status: DC | PRN

## 2023-05-14 MED ORDER — LEVETIRACETAM IN NACL 1000 MG/100ML IV SOLN
1000.0000 mg | Freq: Once | INTRAVENOUS | Status: AC
  Administered 2023-05-14: 1000 mg via INTRAVENOUS
  Filled 2023-05-14: qty 100

## 2023-05-14 MED ORDER — LORAZEPAM 1 MG PO TABS
1.0000 mg | ORAL_TABLET | ORAL | Status: DC | PRN
  Administered 2023-05-15: 1 mg via ORAL
  Administered 2023-05-15 (×2): 2 mg via ORAL
  Administered 2023-05-16: 3 mg via ORAL
  Administered 2023-05-16 (×2): 1 mg via ORAL
  Filled 2023-05-14: qty 2
  Filled 2023-05-14: qty 1
  Filled 2023-05-14: qty 3
  Filled 2023-05-14: qty 1
  Filled 2023-05-14 (×2): qty 2

## 2023-05-14 NOTE — Assessment & Plan Note (Signed)
Replete and trend 

## 2023-05-14 NOTE — Assessment & Plan Note (Signed)
Continue PPI ?

## 2023-05-14 NOTE — ED Notes (Signed)
ED TO INPATIENT HANDOFF REPORT  ED Nurse Name and Phone #: Kathryne Hitch [phone]  S Name/Age/Gender Dillon Gonzalez 42 y.o. male Room/Bed: WA13/WA13  Code Status   Code Status: Not on file  Home/SNF/Other Home Patient oriented to: self, place, time, and situation Is this baseline? Yes   Triage Complete: Triage complete  Chief Complaint Subdural hematoma (HCC) [S06.5XAA]  Triage Note Pt arrived via POV. Pt fell last night, and then had seizure this AM. Pt had CT scan this AM that showed a possible brain bleed.  Pt has hx seizures.  Pt AOx4   Allergies Allergies  Allergen Reactions   Amlodipine Other (See Comments)    Lethargy and fatigue  Tiredness- currently taking and tolerating     Atenolol-Chlorthalidone Other (See Comments)    Tremors   Lisinopril Nausea And Vomiting and Other (See Comments)    GI Intolerance   Losartan    Paroxetine Diarrhea   Sertraline Nausea And Vomiting and Other (See Comments)    GI Intolerance   Telmisartan Swelling    Level of Care/Admitting Diagnosis ED Disposition     ED Disposition  Admit   Condition  --   Comment  Hospital Area: [facility] [[id number]]  Level of Care: Telemetry [5]  Admit to tele based on following criteria: Monitor QTC interval  May place patient in observation at [facility] or [facility] if equivalent level of care is available:: Yes  Covid Evaluation: Asymptomatic - no recent exposure (last 10 days) testing not required  Diagnosis: Subdural hematoma [facility]) [[id number]]  Admitting Physician: Samara Snide  Attending Physician: Samara Snide          B Medical/Surgery History Past Medical History:  Diagnosis Date   Anxiety    Back pain    Hypertension    Knee pain    Past Surgical History:  Procedure Laterality Date   BACK SURGERY     KNEE SURGERY       A IV Location/Drains/Wounds Patient Lines/Drains/Airways Status     Active Line/Drains/Airways     Name  Placement date Placement time Site Days   Peripheral IV 05/14/23 18 G Anterior;Left Forearm 05/14/23  0500  Forearm  less than 1   Peripheral IV 05/14/23 20 G Anterior;Left Hand 05/14/23  2057  Hand  less than 1            Intake/Output Last 24 hours No intake or output data in the 24 hours ending 05/14/23 2150  Labs/Imaging Results for orders placed or performed during the hospital encounter of 05/14/23 (from the past 48 hour(s))  CBG monitoring, ED     Status: Abnormal   Collection Time: 05/14/23  6:50 PM  Result Value Ref Range   Glucose-Capillary 117 (H) 70 - 99 mg/dL    Comment: Glucose reference range applies only to samples taken after fasting for at least 8 hours.  CBC with Differential     Status: Abnormal   Collection Time: 05/14/23  7:28 PM  Result Value Ref Range   WBC 8.5 4.0 - 10.5 K/uL   RBC 4.29 4.22 - 5.81 MIL/uL   Hemoglobin 14.6 13.0 - 17.0 g/dL   HCT 32.4 40.1 - 02.7 %   MCV 93.2 80.0 - 100.0 fL   MCH 34.0 26.0 - 34.0 pg   MCHC 36.5 (H) 30.0 - 36.0 g/dL   RDW 25.3 66.4 - 40.3 %   Platelets 182 150 - 400 K/uL   nRBC 0.0  0.0 - 0.2 %   Neutrophils Relative % 73 %   Neutro Abs 6.2 1.7 - 7.7 K/uL   Lymphocytes Relative 11 %   Lymphs Abs 0.9 0.7 - 4.0 K/uL   Monocytes Relative 11 %   Monocytes Absolute 0.9 0.1 - 1.0 K/uL   Eosinophils Relative 1 %   Eosinophils Absolute 0.0 0.0 - 0.5 K/uL   Basophils Relative 1 %   Basophils Absolute 0.1 0.0 - 0.1 K/uL   Immature Granulocytes 3 %   Abs Immature Granulocytes 0.26 (H) 0.00 - 0.07 K/uL    Comment: Performed at [facility], [address] [place], [place] [address]  Comprehensive metabolic panel     Status: Abnormal   Collection Time: 05/14/23  7:28 PM  Result Value Ref Range   Sodium 132 (L) 135 - 145 mmol/L   Potassium 2.4 (LL) 3.5 - 5.1 mmol/L    Comment: CRITICAL RESULT CALLED TO, READ BACK BY AND VERIFIED WITH Dynver Clemson,N @ 2028 05/14/23 BY CHILDRESS,E   Chloride 90 (L) 98 - 111  mmol/L   CO2 26 22 - 32 mmol/L   Glucose, Bld 111 (H) 70 - 99 mg/dL    Comment: Glucose reference range applies only to samples taken after fasting for at least 8 hours.   BUN 12 6 - 20 mg/dL   Creatinine, Ser 6.04 0.61 - 1.24 mg/dL   Calcium 8.0 (L) 8.9 - 10.3 mg/dL   Total Protein 8.1 6.5 - 8.1 g/dL   Albumin 4.2 3.5 - 5.0 g/dL   AST 540 (H) 15 - 41 U/L   ALT 74 (H) 0 - 44 U/L   Alkaline Phosphatase 87 38 - 126 U/L   Total Bilirubin 2.3 (H) 0.3 - 1.2 mg/dL   GFR, Estimated >98 >11 mL/min    Comment: (NOTE) Calculated using the CKD-EPI Creatinine Equation (2021)    Anion gap 16 (H) 5 - 15    Comment: Performed at [facility], [address], [place], [place] [address]  Ethanol     Status: None   Collection Time: 05/14/23  7:28 PM  Result Value Ref Range   Alcohol, Ethyl (B) <10 <10 mg/dL    Comment: (NOTE) Lowest detectable limit for serum alcohol is 10 mg/dL.  For medical purposes only. Performed at [facility], [address] [place], [place] [address]   Magnesium     Status: Abnormal   Collection Time: 05/14/23  7:28 PM  Result Value Ref Range   Magnesium 1.3 (L) 1.7 - 2.4 mg/dL    Comment: Performed at [facility], [address], [place], [place] [address]   CT HEAD WO CONTRAST ( )  Result Date: 05/14/2023 CLINICAL DATA:  Fall yesterday with recent seizure activity, initial encounter EXAM: CT HEAD WITHOUT CONTRAST TECHNIQUE: Contiguous axial images were obtained from the base of the skull through the vertex without intravenous contrast. RADIATION DOSE REDUCTION: This exam was performed according to the departmental dose-optimization program which includes automated exposure control, adjustment of the mA and/or kV according to patient size and/or use of iterative reconstruction technique. COMPARISON:  01/29/2022 FINDINGS: Brain: There are changes consistent with a small right-sided subdural hematoma in the  parietal region. This measures approximately 4 mm in greatest dimension. No significant midline shift or mass effect is noted. No other areas of hemorrhage are seen. Mild atrophic changes are noted. Vascular: No hyperdense vessel or unexpected calcification. Skull: Normal. Negative for fracture or focal lesion.  Sinuses/Orbits: No acute finding. Other: None. IMPRESSION: Small right-sided subdural hematoma measuring 4 mm in greatest dimension. No significant mass effect or midline shift is noted. Critical Value/emergent results were called by telephone at the time of interpretation on 05/14/2023 at 7:23 pm to Dr. Chaney Malling , who verbally acknowledged these results. Electronically Signed   By: Alcide Clever M.D.   On: 05/14/2023 19:26    Pending Labs Unresulted Labs (From admission, onward)    None       Vitals/Pain Today's Vitals   05/14/23 2030 05/14/23 2100 05/14/23 2118 05/14/23 2130  BP: 127/74 131/80 131/80 (!) 108/94  Pulse: 76 85 85 82  Resp: 17 11  16   Temp:      TempSrc:      SpO2: 98% 96%  99%  Weight:      Height:      PainSc:        Isolation Precautions No active isolations  Medications Medications  potassium chloride 10 mEq in 100 mL IVPB (10 mEq Intravenous New Bag/Given 05/14/23 2050)  magnesium sulfate IVPB 2 g 50 mL (2 g Intravenous New Bag/Given 05/14/23 2052)  calcium gluconate 1 g/ 50 mL sodium chloride IVPB (1,000 mg Intravenous New Bag/Given 05/14/23 2059)  levETIRAcetam (KEPPRA) IVPB 1000 mg/100 mL premix (0 mg Intravenous Stopped 05/14/23 2004)  potassium chloride SA (KLOR-CON M) CR tablet 40 mEq (40 mEq Oral Given 05/14/23 2049)    Mobility walks     Focused Assessments Neuro Assessment Handoff:  Swallow screen pass? Yes  Cardiac Rhythm: Normal sinus rhythm       Neuro Assessment: Exceptions to WDL Neuro Checks:      Has TPA been given? Yes Temp: 99.7 F (37.6 C) (05/14 1846) Temp Source: Oral (05/14 1846) BP: 108/94 (05/14 2130) Pulse Rate: 82  (05/14 2130) If patient is a Neuro Trauma and patient is going to OR before floor call report to 4N Charge nurse: [phone] or [phone]   R Recommendations: See Admitting Provider Note  Report given to:   Additional Notes:

## 2023-05-14 NOTE — ED Triage Notes (Signed)
Pt arrived via POV. Pt fell last night, and then had seizure this AM. Pt had CT scan this AM that showed a possible brain bleed.  Pt has hx seizures.  Pt AOx4

## 2023-05-14 NOTE — Assessment & Plan Note (Addendum)
Likely related to chronic alcohol use with possible fatty liver as well Check PT/INR in a.m.

## 2023-05-14 NOTE — Hospital Course (Signed)
Dillon Gonzalez is a 42 y.o. male with medical history significant of heavy alcohol use, OSA not on CPAP, GERD, HTN, anxiety, recent seizure disorder possibly related to alcohol withdrawal who comes in today from the [place] having presented there and found to have a subdural hematoma.  The patient reports a fall on Saturday evening.  He reports tonic-clonic seizure activity on yesterday.  He was instructed to come to the ED following his finding.  In the ED he was found to have a prolonged QT at 600 ms.  He was also found to have significant electrolyte derangements including potassium of 2.4.  His LFTs are  abnormal with AST of 129, ALT of 74, and T. bili of 2.3.  His QT is noted to be quite prolonged.

## 2023-05-14 NOTE — Assessment & Plan Note (Addendum)
Questionably related to SCh versus alcohol withdrawal versus other (medication) per neurosurgery loaded on Keppra 1000 mg Continue Keppra twice daily Follow-up outpatient neurology workup regarding begun with normal EEG and MRI there previously

## 2023-05-14 NOTE — H&P (Signed)
History and Physical    Patient: Dillon Gonzalez:[id number] DOB: September 28, 1981 DOA: 05/14/2023 DOS: the patient was seen and examined on 05/14/2023 PCP: Freada Bergeron, MD  Patient coming from: Home  Chief Complaint:  Chief Complaint  Patient presents with   Fall   Head Injury   HPI: Dillon Gonzalez is a 42 y.o. male with medical history significant of heavy alcohol use, OSA not on CPAP, GERD, HTN, anxiety, recent seizure disorder possibly related to alcohol withdrawal who comes in today from the [place] having presented there and found to have a subdural hematoma.  The patient reports a fall on Saturday evening.  He reports tonic-clonic seizure activity on yesterday.  He was instructed to come to the ED following his finding.  In the ED he was found to have a prolonged QT at 600 ms.  He was also found to have significant electrolyte derangements including potassium of 2.4.  His LFTs are  abnormal with AST of 129, ALT of 74, and T. bili of 2.3.  His QT is noted to be quite prolonged. Review of Systems: As mentioned in the history of present illness. All other systems reviewed and are negative. Past Medical History:  Diagnosis Date   Anxiety    Back pain    Hypertension    Knee pain    Past Surgical History:  Procedure Laterality Date   BACK SURGERY     KNEE SURGERY     Social History:  reports that he has quit smoking. He does not have any smokeless tobacco history on file. He reports current alcohol use. He reports that he does not use drugs.  Allergies  Allergen Reactions   Amlodipine Other (See Comments)    Lethargy and fatigue  Tiredness- currently taking and tolerating     Atenolol-Chlorthalidone Other (See Comments)    Tremors   Lisinopril Nausea And Vomiting and Other (See Comments)    GI Intolerance   Losartan    Paroxetine Diarrhea   Sertraline Nausea And Vomiting and Other (See Comments)    GI Intolerance   Telmisartan Swelling    No family history on file.  Prior to  Admission medications   Medication Sig Start Date End Date Taking? Authorizing Provider  celecoxib (CELEBREX) 200 MG capsule 1 capsule with food Orally Once a day for 30 day(s) 09/25/21   [provider]  Cholecalciferol 25 MCG (1000 UT) tablet Take by mouth.    [provider]  cyclobenzaprine (FLEXERIL) 10 MG tablet Takes half tablet    [provider]  LORazepam (ATIVAN) 1 MG tablet Take 1 tablet (1 mg total) by mouth every 8 (eight) hours as needed for anxiety or seizure. 01/29/22   Benjiman Core, MD  omeprazole (PRILOSEC) 20 MG capsule 1 capsule 30 minutes before morning meal Orally Once a day for 30 day(s) 10/03/20   [provider]  tiZANidine (ZANAFLEX) 4 MG tablet Take 1 tablet (4 mg total) by mouth every 6 (six) hours as needed for muscle spasms. Patient not taking: Reported on 02/21/2022 10/28/20   Patterson Hammersmith C, PA-C  traZODone (DESYREL) 50 MG tablet Take by mouth. 03/22/21   [provider]    Physical Exam: Vitals:   05/14/23 2030 05/14/23 2100 05/14/23 2118 05/14/23 2130  BP: 127/74 131/80 131/80 (!) 108/94  Pulse: 76 85 85 82  Resp: 17 11  16   Temp:      TempSrc:      SpO2: 98% 96%  99%  Weight:  Height:       Physical Examination: General appearance - alert, well appearing, and in no distress Mental status - alert, oriented to person, place, and time Chest - clear to auscultation, no wheezes, rales or rhonchi, symmetric air entry Heart - normal rate, regular rhythm, normal S1, S2, no murmurs, rubs, clicks or gallops Abdomen - soft, nontender, nondistended, no masses or organomegaly Extremities - peripheral pulses normal, no pedal edema, no clubbing or cyanosis Skin - normal coloration and turgor, no rashes, no suspicious skin lesions noted Abrasion across right forehead Neuro - non-focal   Data Reviewed: Results for orders placed or performed during the hospital encounter of 05/14/23 (from the past 24 hour(s))   CBG monitoring, ED     Status: Abnormal   Collection Time: 05/14/23  6:50 PM  Result Value Ref Range   Glucose-Capillary 117 (H) 70 - 99 mg/dL  CBC with Differential     Status: Abnormal   Collection Time: 05/14/23  7:28 PM  Result Value Ref Range   WBC 8.5 4.0 - 10.5 K/uL   RBC 4.29 4.22 - 5.81 MIL/uL   Hemoglobin 14.6 13.0 - 17.0 g/dL   HCT 16.1 09.6 - 04.5 %   MCV 93.2 80.0 - 100.0 fL   MCH 34.0 26.0 - 34.0 pg   MCHC 36.5 (H) 30.0 - 36.0 g/dL   RDW 40.9 81.1 - 91.4 %   Platelets 182 150 - 400 K/uL   nRBC 0.0 0.0 - 0.2 %   Neutrophils Relative % 73 %   Neutro Abs 6.2 1.7 - 7.7 K/uL   Lymphocytes Relative 11 %   Lymphs Abs 0.9 0.7 - 4.0 K/uL   Monocytes Relative 11 %   Monocytes Absolute 0.9 0.1 - 1.0 K/uL   Eosinophils Relative 1 %   Eosinophils Absolute 0.0 0.0 - 0.5 K/uL   Basophils Relative 1 %   Basophils Absolute 0.1 0.0 - 0.1 K/uL   Immature Granulocytes 3 %   Abs Immature Granulocytes 0.26 (H) 0.00 - 0.07 K/uL  Comprehensive metabolic panel     Status: Abnormal   Collection Time: 05/14/23  7:28 PM  Result Value Ref Range   Sodium 132 (L) 135 - 145 mmol/L   Potassium 2.4 (LL) 3.5 - 5.1 mmol/L   Chloride 90 (L) 98 - 111 mmol/L   CO2 26 22 - 32 mmol/L   Glucose, Bld 111 (H) 70 - 99 mg/dL   BUN 12 6 - 20 mg/dL   Creatinine, Ser 7.82 0.61 - 1.24 mg/dL   Calcium 8.0 (L) 8.9 - 10.3 mg/dL   Total Protein 8.1 6.5 - 8.1 g/dL   Albumin 4.2 3.5 - 5.0 g/dL   AST 956 (H) 15 - 41 U/L   ALT 74 (H) 0 - 44 U/L   Alkaline Phosphatase 87 38 - 126 U/L   Total Bilirubin 2.3 (H) 0.3 - 1.2 mg/dL   GFR, Estimated >21 >30 mL/min   Anion gap 16 (H) 5 - 15  Ethanol     Status: None   Collection Time: 05/14/23  7:28 PM  Result Value Ref Range   Alcohol, Ethyl (B) <10 <10 mg/dL  Magnesium     Status: Abnormal   Collection Time: 05/14/23  7:28 PM  Result Value Ref Range   Magnesium 1.3 (L) 1.7 - 2.4 mg/dL   CT HEAD WO CONTRAST ( )  Result Date: 05/14/2023 CLINICAL DATA:  Fall  yesterday with recent seizure activity, initial encounter EXAM: CT HEAD WITHOUT CONTRAST  TECHNIQUE: Contiguous axial images were obtained from the base of the skull through the vertex without intravenous contrast. RADIATION DOSE REDUCTION: This exam was performed according to the departmental dose-optimization program which includes automated exposure control, adjustment of the mA and/or kV according to patient size and/or use of iterative reconstruction technique. COMPARISON:  01/29/2022 FINDINGS: Brain: There are changes consistent with a small right-sided subdural hematoma in the parietal region. This measures approximately 4 mm in greatest dimension. No significant midline shift or mass effect is noted. No other areas of hemorrhage are seen. Mild atrophic changes are noted. Vascular: No hyperdense vessel or unexpected calcification. Skull: Normal. Negative for fracture or focal lesion. Sinuses/Orbits: No acute finding. Other: None. IMPRESSION: Small right-sided subdural hematoma measuring 4 mm in greatest dimension. No significant mass effect or midline shift is noted. Critical Value/emergent results were called by telephone at the time of interpretation on 05/14/2023 at 7:23 pm to Dr. Chaney Malling , who verbally acknowledged these results. Electronically Signed   By: Alcide Clever M.D.   On: 05/14/2023 19:26    EKG: Prolonged QT with incomplete bundle branch block  Assessment and Plan: * Subdural hematoma (HCC) Likely subacute.  Patient fell on Saturday.  Had 2 scans several hours apart between here in the [facility] that showed no progression of lesion. Neurosurgery was consulted in the ED who felt he was stable for monitoring.  Prolonged QT interval Likely related to hypokalemia as well as start medications. Hold QT prolonging meds  Hypokalemia Profound at 2.4.  Patient reports some history of GI losses and poor p.o. intake though which he describes as a single accent that would make his  potassium 2.4.  Holding chlorthalidone Replete with both IV and p.o. and trend  Post-traumatic stress disorder, chronic On prazosin usually Holding this due to seizure  Other seizures (HCC) Questionably related to SCh versus alcohol withdrawal versus other (medication) per neurosurgery loaded on Keppra 1000 mg Continue Keppra twice daily Follow-up outpatient neurology workup regarding begun with normal EEG and MRI there previously  Hypocalcemia Replete and trend  Gastro-esophageal reflux disease without esophagitis Continue PPI  Essential (primary) hypertension Continue amlodipine and atenolol. Holding chlorthalidone due to potassium  Shortness of breath Usually on albuterol This is held due to prolonged QT  Anxiety Continue BuSpar  Abnormal results of liver function studies Likely related to chronic alcohol use with possible fatty liver as well Check PT/INR in a.m.      Advance Care Planning:   Code Status: Not on file Full  Consults: Neurosurgery in the ED  Family Communication: Close friend at bedside  Severity of Illness: The appropriate patient status for this patient is INPATIENT. Inpatient status is judged to be reasonable and necessary in order to provide the required intensity of service to ensure the patient's safety. The patient's presenting symptoms, physical exam findings, and initial radiographic and laboratory data in the context of their chronic comorbidities is felt to place them at high risk for further clinical deterioration. Furthermore, it is not anticipated that the patient will be medically stable for discharge from the hospital within 2 midnights of admission.   * I certify that at the point of admission it is my clinical judgment that the patient will require inpatient hospital care spanning beyond 2 midnights from the point of admission due to high intensity of service, high risk for further deterioration and high frequency of surveillance  required.*  Author: Reva Bores, MD 05/14/2023 9:58 PM  For on  call review [email or web address].

## 2023-05-14 NOTE — Assessment & Plan Note (Signed)
Profound at 2.4.  Patient reports some history of GI losses and poor p.o. intake though which he describes as a single accent that would make his potassium 2.4.  Holding chlorthalidone Replete with both IV and p.o. and trend

## 2023-05-14 NOTE — Assessment & Plan Note (Signed)
-   Continue BuSpar 

## 2023-05-14 NOTE — Assessment & Plan Note (Signed)
Likely subacute.  Patient fell on Saturday.  Had 2 scans several hours apart between here in the [facility] that showed no progression of lesion. Neurosurgery was consulted in the ED who felt he was stable for monitoring.

## 2023-05-14 NOTE — Assessment & Plan Note (Signed)
On prazosin usually Holding this due to seizure

## 2023-05-14 NOTE — ED Provider Notes (Signed)
[place] EMERGENCY DEPARTMENT AT [facility] Provider Note   CSN: [id number] Arrival date & time: 05/14/23  1840     History  Chief Complaint  Patient presents with   Fall   Head Injury    Dillon Gonzalez is a 42 y.o. male history of alcohol abuse, here presenting with head injury and seizure.  Patient states that he had a head injury 3 days ago.  He states that he did not remember what happened and woke up Sunday morning and noticed abrasion of his right scalp.  He had headache at that time.  Yesterday mother witnessed tonic-clonic seizure activity that lasted lasted for 5 minutes.  He was postictal afterwards.  He was not brought to the ER and instead they went to [facility] urgent care this morning.  He had a CT scan that was done that showed right-sided subdural hematoma.  He was sent to the ER for further evaluation.  Patient states that about a year ago he had seizure-like activity and was seen by neurology and was not put on seizure meds.  Patient continues to drink alcohol 2-3 times a week.  The history is provided by the patient.       Home Medications Prior to Admission medications   Medication Sig Start Date End Date Taking? Authorizing Provider  celecoxib (CELEBREX) 200 MG capsule 1 capsule with food Orally Once a day for 30 day(s) 09/25/21   [provider]  Cholecalciferol 25 MCG (1000 UT) tablet Take by mouth.    [provider]  cyclobenzaprine (FLEXERIL) 10 MG tablet Takes half tablet    [provider]  LORazepam (ATIVAN) 1 MG tablet Take 1 tablet (1 mg total) by mouth every 8 (eight) hours as needed for anxiety or seizure. 01/29/22   Benjiman Core, MD  omeprazole (PRILOSEC) 20 MG capsule 1 capsule 30 minutes before morning meal Orally Once a day for 30 day(s) 10/03/20   [provider]  tiZANidine (ZANAFLEX) 4 MG tablet Take 1 tablet (4 mg total) by mouth every 6 (six) hours as needed for muscle spasms. Patient  not taking: Reported on 02/21/2022 10/28/20   Patterson Hammersmith C, PA-C  traZODone (DESYREL) 50 MG tablet Take by mouth. 03/22/21   [provider]      Allergies    Lisinopril, Losartan, Paroxetine, Sertraline, and Telmisartan    Review of Systems   Review of Systems  Neurological:  Positive for seizures.  All other systems reviewed and are negative.   Physical Exam Updated Vital Signs BP (!) 152/84 (BP Location: Left Arm)   Pulse 88   Temp 99.7 F (37.6 C) (Oral)   Resp 18   Ht 6\' 3"  (1.905 m)   Wt 88.5 kg   SpO2 99%   BMI 24.37 kg/m  Physical Exam Vitals and nursing note reviewed.  Constitutional:      Comments: Awake and alert  HENT:     Head:     Comments: Abrasion of the right frontal scalp    Nose: Nose normal.     Mouth/Throat:     Mouth: Mucous membranes are moist.  Eyes:     Extraocular Movements: Extraocular movements intact.     Pupils: Pupils are equal, round, and reactive to light.  Cardiovascular:     Rate and Rhythm: Normal rate and regular rhythm.     Pulses: Normal pulses.     Heart sounds: Normal heart sounds.  Pulmonary:     Effort: Pulmonary  effort is normal.     Breath sounds: Normal breath sounds.  Abdominal:     General: Abdomen is flat.     Palpations: Abdomen is soft.  Musculoskeletal:        General: Normal range of motion.     Cervical back: Normal range of motion and neck supple.  Skin:    General: Skin is warm.     Capillary Refill: Capillary refill takes less than 2 seconds.  Neurological:     General: No focal deficit present.     Mental Status: He is alert and oriented to person, place, and time.  Psychiatric:        Mood and Affect: Mood normal.        Behavior: Behavior normal.     ED Results / Procedures / Treatments   Labs (all labs ordered are listed, but only abnormal results are displayed) Labs Reviewed  CBG MONITORING, ED - Abnormal; Notable for the following components:      Result Value    Glucose-Capillary 117 (*)    All other components within normal limits  CBC WITH DIFFERENTIAL/PLATELET  COMPREHENSIVE METABOLIC PANEL  ETHANOL    EKG EKG Interpretation  Date/Time:  Tuesday May 14 2023 18:52:49 EDT Ventricular Rate:  93 PR Interval:  139 QRS Duration: 118 QT Interval:  482 QTC Calculation: 600 R Axis:   31 Text Interpretation: Sinus rhythm Incomplete right bundle branch block No significant change since last tracing Confirmed by Richardean Canal [phone]) on 05/14/2023 7:09:08 PM  Radiology CT HEAD WO CONTRAST ( )  Result Date: 05/14/2023 CLINICAL DATA:  Fall yesterday with recent seizure activity, initial encounter EXAM: CT HEAD WITHOUT CONTRAST TECHNIQUE: Contiguous axial images were obtained from the base of the skull through the vertex without intravenous contrast. RADIATION DOSE REDUCTION: This exam was performed according to the departmental dose-optimization program which includes automated exposure control, adjustment of the mA and/or kV according to patient size and/or use of iterative reconstruction technique. COMPARISON:  01/29/2022 FINDINGS: Brain: There are changes consistent with a small right-sided subdural hematoma in the parietal region. This measures approximately 4 mm in greatest dimension. No significant midline shift or mass effect is noted. No other areas of hemorrhage are seen. Mild atrophic changes are noted. Vascular: No hyperdense vessel or unexpected calcification. Skull: Normal. Negative for fracture or focal lesion. Sinuses/Orbits: No acute finding. Other: None. IMPRESSION: Small right-sided subdural hematoma measuring 4 mm in greatest dimension. No significant mass effect or midline shift is noted. Critical Value/emergent results were called by telephone at the time of interpretation on 05/14/2023 at 7:23 pm to Dr. Chaney Malling , who verbally acknowledged these results. Electronically Signed   By: Alcide Clever M.D.   On: 05/14/2023 19:26     Procedures Procedures    CRITICAL CARE Performed by: Richardean Canal   Total critical care time: 38 minutes  Critical care time was exclusive of separately billable procedures and treating other patients.  Critical care was necessary to treat or prevent imminent or life-threatening deterioration.  Critical care was time spent personally by me on the following activities: development of treatment plan with patient and/or surrogate as well as nursing, discussions with consultants, evaluation of patient's response to treatment, examination of patient, obtaining history from patient or surrogate, ordering and performing treatments and interventions, ordering and review of laboratory studies, ordering and review of radiographic studies, pulse oximetry and re-evaluation of patient's condition.   Medications Ordered in ED Medications  levETIRAcetam (KEPPRA)  IVPB 1000 mg/100 mL premix (1,000 mg Intravenous New Bag/Given 05/14/23 1927)    ED Course/ Medical Decision Making/ A&P                             Medical Decision Making Dillon Gonzalez is a 42 y.o. male here presenting with possible subdural hematoma after head injury.  Is unclear when his last head injury was.  He fell 3 days ago and then he had a seizure yesterday and may have hit his head.  He had a subdural hemorrhage on the CT scan this morning.  Given seizure activity, will load him with Keppra.  Will repeat CT head and labs.  8:42 PM CT head showed 4 mm right subdural hematoma.  I discussed case with Dr. Danielle Dess from neurosurgery.  He recommend Keppra 1000 mg load then 500 twice daily.  He states that this patient does not need repeat imaging.  However, patient does have multiple electrolyte abnormalities.  Potassium is 2.4 and his calcium is 8 and his anion gap is 16.  His alcohol level is negative.  I am concerned for possible alcohol withdrawal seizures.  His QTc is also 600 likely due to hypokalemia.  At this point, patient will  be admitted for possible alcohol withdrawal seizure and electrolyte abnormalities.  Problems Addressed: Hypokalemia: acute illness or injury Seizure [facility]): acute illness or injury Subdural hematoma (HCC): acute illness or injury  Amount and/or Complexity of Data Reviewed Labs: ordered. Decision-making details documented in ED Course. Radiology: ordered and independent interpretation performed. Decision-making details documented in ED Course.  Risk Prescription drug management. Decision regarding hospitalization.    Final Clinical Impression(s) / ED Diagnoses Final diagnoses:  None    Rx / DC Orders ED Discharge Orders     None         Charlynne Pander, MD 05/14/23 2044

## 2023-05-14 NOTE — Assessment & Plan Note (Signed)
Likely related to hypokalemia as well as start medications. Hold QT prolonging meds

## 2023-05-14 NOTE — Assessment & Plan Note (Signed)
Continue amlodipine and atenolol. Holding chlorthalidone due to potassium

## 2023-05-14 NOTE — Assessment & Plan Note (Signed)
Usually on albuterol This is held due to prolonged QT

## 2023-05-15 ENCOUNTER — Encounter (HOSPITAL_COMMUNITY): Payer: Self-pay | Admitting: Family Medicine

## 2023-05-15 DIAGNOSIS — G40509 Epileptic seizures related to external causes, not intractable, without status epilepticus: Secondary | ICD-10-CM | POA: Diagnosis present

## 2023-05-15 DIAGNOSIS — K219 Gastro-esophageal reflux disease without esophagitis: Secondary | ICD-10-CM | POA: Diagnosis present

## 2023-05-15 DIAGNOSIS — S0001XA Abrasion of scalp, initial encounter: Secondary | ICD-10-CM | POA: Diagnosis present

## 2023-05-15 DIAGNOSIS — G4733 Obstructive sleep apnea (adult) (pediatric): Secondary | ICD-10-CM | POA: Diagnosis present

## 2023-05-15 DIAGNOSIS — E876 Hypokalemia: Secondary | ICD-10-CM

## 2023-05-15 DIAGNOSIS — R945 Abnormal results of liver function studies: Secondary | ICD-10-CM | POA: Diagnosis present

## 2023-05-15 DIAGNOSIS — F4312 Post-traumatic stress disorder, chronic: Secondary | ICD-10-CM | POA: Diagnosis present

## 2023-05-15 DIAGNOSIS — F10139 Alcohol abuse with withdrawal, unspecified: Secondary | ICD-10-CM | POA: Diagnosis present

## 2023-05-15 DIAGNOSIS — R569 Unspecified convulsions: Secondary | ICD-10-CM | POA: Diagnosis not present

## 2023-05-15 DIAGNOSIS — S065XAA Traumatic subdural hemorrhage with loss of consciousness status unknown, initial encounter: Secondary | ICD-10-CM

## 2023-05-15 DIAGNOSIS — R9431 Abnormal electrocardiogram [ECG] [EKG]: Secondary | ICD-10-CM

## 2023-05-15 DIAGNOSIS — Z87891 Personal history of nicotine dependence: Secondary | ICD-10-CM | POA: Diagnosis not present

## 2023-05-15 DIAGNOSIS — W19XXXA Unspecified fall, initial encounter: Secondary | ICD-10-CM | POA: Diagnosis present

## 2023-05-15 DIAGNOSIS — G4089 Other seizures: Secondary | ICD-10-CM | POA: Diagnosis present

## 2023-05-15 DIAGNOSIS — R0602 Shortness of breath: Secondary | ICD-10-CM | POA: Diagnosis present

## 2023-05-15 DIAGNOSIS — F10939 Alcohol use, unspecified with withdrawal, unspecified: Secondary | ICD-10-CM | POA: Diagnosis present

## 2023-05-15 DIAGNOSIS — I10 Essential (primary) hypertension: Secondary | ICD-10-CM | POA: Diagnosis present

## 2023-05-15 LAB — BASIC METABOLIC PANEL
Anion gap: 11 (ref 5–15)
Anion gap: 11 (ref 5–15)
BUN: 10 mg/dL (ref 6–20)
BUN: 11 mg/dL (ref 6–20)
CO2: 26 mmol/L (ref 22–32)
CO2: 27 mmol/L (ref 22–32)
Calcium: 8 mg/dL — ABNORMAL LOW (ref 8.9–10.3)
Calcium: 8.5 mg/dL — ABNORMAL LOW (ref 8.9–10.3)
Chloride: 95 mmol/L — ABNORMAL LOW (ref 98–111)
Chloride: 98 mmol/L (ref 98–111)
Creatinine, Ser: 0.91 mg/dL (ref 0.61–1.24)
Creatinine, Ser: 0.87 mg/dL (ref 0.61–1.24)
GFR, Estimated: >60 mL/min (ref >60)
GFR, Estimated: >60 mL/min (ref >60)
Glucose, Bld: 104 mg/dL — ABNORMAL HIGH (ref 70–99)
Glucose, Bld: 105 mg/dL — ABNORMAL HIGH (ref 70–99)
Potassium: 2.7 mmol/L — CL (ref 3.5–5.1)
Potassium: 3 mmol/L — ABNORMAL LOW (ref 3.5–5.1)
Sodium: 132 mmol/L — ABNORMAL LOW (ref 135–145)
Sodium: 136 mmol/L (ref 135–145)

## 2023-05-15 LAB — MAGNESIUM: Magnesium: 1.9 mg/dL (ref 1.7–2.4)

## 2023-05-15 LAB — CBC
HCT: 37 % — ABNORMAL LOW (ref 39.0–52.0)
Hemoglobin: 13.2 g/dL (ref 13.0–17.0)
MCH: 34.2 pg — ABNORMAL HIGH (ref 26.0–34.0)
MCHC: 35.7 g/dL (ref 30.0–36.0)
MCV: 95.9 fL (ref 80.0–100.0)
Platelets: 163 10*3/uL (ref 150–400)
RBC: 3.86 MIL/uL — ABNORMAL LOW (ref 4.22–5.81)
RDW: 11.9 % (ref 11.5–15.5)
WBC: 6.7 10*3/uL (ref 4.0–10.5)
nRBC: 0 % (ref 0.0–0.2)

## 2023-05-15 LAB — PROTIME-INR
INR: 1.1 (ref 0.8–1.2)
Prothrombin Time: 14 seconds (ref 11.4–15.2)

## 2023-05-15 LAB — HIV ANTIBODY (ROUTINE TESTING W REFLEX): HIV Screen 4th Generation wRfx: NONREACTIVE

## 2023-05-15 MED ORDER — POTASSIUM CHLORIDE 10 MEQ/100ML IV SOLN
10.0000 meq | INTRAVENOUS | Status: DC
  Filled 2023-05-15: qty 100

## 2023-05-15 MED ORDER — CHLORHEXIDINE GLUCONATE 0.12 % MT SOLN
15.0000 mL | Freq: Two times a day (BID) | OROMUCOSAL | Status: DC
  Administered 2023-05-15 – 2023-05-17 (×5): 15 mL via OROMUCOSAL
  Filled 2023-05-15 (×6): qty 15

## 2023-05-15 MED ORDER — POTASSIUM CHLORIDE CRYS ER 20 MEQ PO TBCR
60.0000 meq | EXTENDED_RELEASE_TABLET | Freq: Once | ORAL | Status: AC
  Administered 2023-05-15: 60 meq via ORAL
  Filled 2023-05-15: qty 3

## 2023-05-15 MED ORDER — POTASSIUM CHLORIDE CRYS ER 20 MEQ PO TBCR
40.0000 meq | EXTENDED_RELEASE_TABLET | Freq: Two times a day (BID) | ORAL | Status: AC
  Administered 2023-05-15 – 2023-05-16 (×2): 40 meq via ORAL
  Filled 2023-05-15 (×2): qty 2

## 2023-05-15 MED ORDER — ORAL CARE MOUTH RINSE: 15.0000 mL | OROMUCOSAL | Status: DC | PRN

## 2023-05-15 MED ORDER — LEVETIRACETAM 500 MG PO TABS
500.0000 mg | ORAL_TABLET | Freq: Two times a day (BID) | ORAL | Status: DC
  Administered 2023-05-15 – 2023-05-17 (×4): 500 mg via ORAL
  Filled 2023-05-15 (×4): qty 1

## 2023-05-15 MED ORDER — POTASSIUM CHLORIDE CRYS ER 20 MEQ PO TBCR
40.0000 meq | EXTENDED_RELEASE_TABLET | Freq: Once | ORAL | Status: AC
  Administered 2023-05-15: 40 meq via ORAL
  Filled 2023-05-15: qty 2

## 2023-05-15 NOTE — TOC Initial Note (Signed)
Transition of Care [facility]) - Initial/Assessment Note    Patient Details  Name: Dillon Gonzalez MRN: [id number] Date of Birth: 1981-01-04  [facility] CM/SW Contact:    Howell Rucks, RN Phone Number: 05/15/2023, 1:37 PM  Clinical Narrative: Met with pt at bedside to introduce role of TOC/NCM and review for dc needs. TOC substance abuse consult, pt declined, pt has family support, has transport at discharge. No further TOC needs identified                  Expected Discharge Plan: Home/Self Care Barriers to Discharge: Continued Medical Work up   Patient Goals and CMS Choice   CMS Medicare.gov Compare Post Acute Care list provided to:: Patient Choice offered to / list presented to : Patient      Expected Discharge Plan and Services   Discharge Planning Services: CM Consult   Living arrangements for the past 2 months: Single Family Home                                      Prior Living Arrangements/Services Living arrangements for the past 2 months: Single Family Home Lives with:: Self Patient language and need for interpreter reviewed:: Yes Do you feel safe going back to the place where you live?: Yes      Need for Family Participation in Patient Care: Yes (Comment) Care giver support system in place?: Yes (comment)   Criminal Activity/Legal Involvement Pertinent to Current Situation/Hospitalization: No - Comment as needed  Activities of Daily Living Home Assistive Devices/Equipment: None ADL Screening (condition at time of admission) Patient's cognitive ability adequate to safely complete daily activities?: Yes Is the patient deaf or have difficulty hearing?: No Does the patient have difficulty seeing, even when wearing glasses/contacts?: No Does the patient have difficulty concentrating, remembering, or making decisions?: No Patient able to express need for assistance with ADLs?: Yes Does the patient have difficulty dressing or bathing?:  No Independently performs ADLs?: Yes (appropriate for developmental age) Does the patient have difficulty walking or climbing stairs?: No Weakness of Legs: None Weakness of Arms/Hands: None  Permission Sought/Granted Permission sought to share information with : Case Manager Permission granted to share information with : Yes, Verbal Permission Granted  Share Information with NAME: Fannie Knee, RN           Emotional Assessment Appearance:: Appears stated age Attitude/Demeanor/Rapport: Gracious Affect (typically observed): Accepting Orientation: : Oriented to Self, Oriented to Place, Oriented to  Time, Oriented to Situation Alcohol / Substance Use: Alcohol Use Psych Involvement: No (comment)  Admission diagnosis:  Hypokalemia [E87.6] Seizure (HCC) [R56.9] Subdural hematoma (HCC) [S06.5XAA] Prolonged QT interval [R94.31] Patient Active Problem List   Diagnosis Date Noted   Subdural hematoma (HCC) 05/14/2023   Abnormal results of liver function studies 05/14/2023   Adjustment disorder 05/14/2023   Anxiety 05/14/2023   Shortness of breath 05/14/2023   Essential (primary) hypertension 05/14/2023   Gastro-esophageal reflux disease without esophagitis 05/14/2023   Hypocalcemia 05/14/2023   Other seizures (HCC) 05/14/2023   Post-traumatic stress disorder, chronic 05/14/2023   Tobacco dependence in remission 05/14/2023   Hypokalemia 05/14/2023   Prolonged QT interval 05/14/2023   Lung nodule 01/22/2022   PCP:  Freada Bergeron, MD Pharmacy:  No Pharmacies Listed    Social Determinants of Health (SDOH) Social History: SDOH Screenings   Food Insecurity: No Food Insecurity (05/15/2023)  Housing: Patient Unable  To Answer (05/15/2023)  Transportation Needs: No Transportation Needs (05/15/2023)  Utilities: Not At Risk (05/15/2023)  Tobacco Use: Medium Risk (05/15/2023)   SDOH Interventions:     Readmission Risk Interventions     No data to display

## 2023-05-15 NOTE — Progress Notes (Signed)
Progress Note   Patient: Dillon Gonzalez ZOX:[id number] DOB: 03/13/1981 DOA: 05/14/2023     0 DOS: the patient was seen and examined on 05/15/2023   Brief hospital course: 42 y.o. male with medical history significant of heavy alcohol use, OSA not on CPAP, GERD, HTN, anxiety, recent seizure disorder possibly related to alcohol withdrawal who comes in today from the [place] having presented there and found to have a subdural hematoma.  The patient reports a fall on Saturday evening.  He reports tonic-clonic seizure activity on yesterday.  He was instructed to come to the ED following his finding.  In the ED he was found to have a prolonged QT at 600 ms.  He was also found to have significant electrolyte derangements including potassium of 2.4.  His LFTs are  abnormal with AST of 129, ALT of 74, and T. bili of 2.3.  His QT is noted to be quite prolonged.   Assessment and Plan: Subdural hematoma (HCC) Likely subacute.  Patient fell on Saturday.  Had 2 scans several hours apart between here in the [facility] that showed no progression of lesion. ED documentation reviewed. Dr. Danielle Dess of Neurosurgery was consulted in the ED stated no repeat imaging was necessary. Neurosurgery recommended continuing keppra 500mg  bid after 1g load in ED -Pt with prolonged QTc at presentation. Keppra on hold for now until QTc improves, see below   Prolonged QT interval Likley secondary to electrolyte abnormalities Hold QT prolonging meds -Will recheck EKG   Hypokalemia Profound at 2.4.  Patient reports some history of GI losses and poor p.o. intake though which he describes as a single accent that would make his potassium 2.4.  Holding chlorthalidone Continue to replete K as tolerated, repeat of 3.0 this afternoon Will give another x 2 PO Recheck bmet in AM   Post-traumatic stress disorder, chronic On prazosin usually Holding this due to seizure   Other seizures (HCC) Questionably related to SCh versus alcohol  withdrawal versus other (medication) per neurosurgery loaded on Keppra 1000 mg Follow-up outpatient neurology workup regarding begun with normal EEG and MRI there previously Keppra BID was recommended by Neurosurgery. Will recheck EKG to confirm improvement in QTc prior to starting Keppra   Hypocalcemia Replete and trend   Gastro-esophageal reflux disease without esophagitis Continue PPI   Essential (primary) hypertension Continue amlodipine and atenolol. Holding chlorthalidone secondary to hypokalemia    Shortness of breath Usually on albuterol This is held due to prolonged QT   Anxiety Continue BuSpar   Abnormal results of liver function studies Likely related to chronic alcohol use with possible fatty liver as well INR 1.1  Hx ETOH abuse with ETOH withdrawals -cessation done at bedside  -This AM, CIWA noted to be as high as 15. CIWA remains elevated at over 11 -cont CIWA protocol     Subjective: Reports feeling better this AM  Physical Exam: Vitals:   05/15/23 0111 05/15/23 0112 05/15/23 0615 05/15/23 1021  BP: 101/70 101/70 126/74 125/74  Pulse: 80 80 84 81  Resp: 19 17 19 17   Temp: 98.2 F (36.8 C) 98.2 F (36.8 C) 99.6 F (37.6 C) 98.8 F (37.1 C)  TempSrc: Oral Oral Oral Oral  SpO2: 98% 98% 98% 98%  Weight:      Height:       General exam: Awake, laying in bed, in nad Respiratory system: Normal respiratory effort, no wheezing Cardiovascular system: regular rate, s1, s2 Gastrointestinal system: Soft, nondistended, positive BS Central nervous system: CN2-12  grossly intact, strength intact Extremities: Perfused, no clubbing Skin: Normal skin turgor, no notable skin lesions seen Psychiatry: Mood normal // no visual hallucinations   Data Reviewed:  Labs reviewed: Na 136, K 3.0, Cr 0.87, Mg 1.9  Family Communication: Pt in room, family not at bedside  Disposition: Status is: Inpatient Remains inpatient appropriate because: Severity of illness   Planned Discharge Destination: Home     Author: Rickey Barbara, MD 05/15/2023 4:10 PM  For on call review [email or web address].

## 2023-05-15 NOTE — Hospital Course (Signed)
42 y.o. male with medical history significant of heavy alcohol use, OSA not on CPAP, GERD, HTN, anxiety, recent seizure disorder possibly related to alcohol withdrawal who comes in today from the [place] having presented there and found to have a subdural hematoma.  The patient reports a fall on Saturday evening.  He reports tonic-clonic seizure activity on yesterday.  He was instructed to come to the ED following his finding.  In the ED he was found to have a prolonged QT at 600 ms.  He was also found to have significant electrolyte derangements including potassium of 2.4.  His LFTs are  abnormal with AST of 129, ALT of 74, and T. bili of 2.3.  His QT is noted to be quite prolonged.

## 2023-05-16 ENCOUNTER — Inpatient Hospital Stay (HOSPITAL_COMMUNITY): Payer: No Typology Code available for payment source

## 2023-05-16 ENCOUNTER — Inpatient Hospital Stay (HOSPITAL_COMMUNITY)
Admit: 2023-05-16 | Discharge: 2023-05-16 | Disposition: A | Discharge status: Home / Self Care | Payer: No Typology Code available for payment source | Attending: Internal Medicine | Admitting: Internal Medicine

## 2023-05-16 DIAGNOSIS — E876 Hypokalemia: Secondary | ICD-10-CM | POA: Diagnosis not present

## 2023-05-16 DIAGNOSIS — S065XAA Traumatic subdural hemorrhage with loss of consciousness status unknown, initial encounter: Secondary | ICD-10-CM | POA: Diagnosis not present

## 2023-05-16 DIAGNOSIS — R569 Unspecified convulsions: Secondary | ICD-10-CM | POA: Diagnosis not present

## 2023-05-16 DIAGNOSIS — R9431 Abnormal electrocardiogram [ECG] [EKG]: Secondary | ICD-10-CM | POA: Diagnosis not present

## 2023-05-16 LAB — CBC
HCT: 38.9 % — ABNORMAL LOW (ref 39.0–52.0)
Hemoglobin: 13.4 g/dL (ref 13.0–17.0)
MCH: 34 pg (ref 26.0–34.0)
MCHC: 34.4 g/dL (ref 30.0–36.0)
MCV: 98.7 fL (ref 80.0–100.0)
Platelets: 183 10*3/uL (ref 150–400)
RBC: 3.94 MIL/uL — ABNORMAL LOW (ref 4.22–5.81)
RDW: 11.7 % (ref 11.5–15.5)
WBC: 8.2 10*3/uL (ref 4.0–10.5)
nRBC: 0 % (ref 0.0–0.2)

## 2023-05-16 LAB — COMPREHENSIVE METABOLIC PANEL
ALT: 55 U/L — ABNORMAL HIGH (ref 0–44)
AST: 90 U/L — ABNORMAL HIGH (ref 15–41)
Albumin: 3.5 g/dL (ref 3.5–5.0)
Alkaline Phosphatase: 84 U/L (ref 38–126)
Anion gap: 9 (ref 5–15)
BUN: 11 mg/dL (ref 6–20)
CO2: 24 mmol/L (ref 22–32)
Calcium: 8.8 mg/dL — ABNORMAL LOW (ref 8.9–10.3)
Chloride: 98 mmol/L (ref 98–111)
Creatinine, Ser: 0.82 mg/dL (ref 0.61–1.24)
GFR, Estimated: >60 mL/min (ref >60)
Glucose, Bld: 102 mg/dL — ABNORMAL HIGH (ref 70–99)
Potassium: 3.5 mmol/L (ref 3.5–5.1)
Sodium: 131 mmol/L — ABNORMAL LOW (ref 135–145)
Total Bilirubin: 1.4 mg/dL — ABNORMAL HIGH (ref 0.3–1.2)
Total Protein: 7.1 g/dL (ref 6.5–8.1)

## 2023-05-16 LAB — MAGNESIUM: Magnesium: 1.6 mg/dL — ABNORMAL LOW (ref 1.7–2.4)

## 2023-05-16 MED ORDER — MAGNESIUM SULFATE 2 GM/50ML IV SOLN
2.0000 g | Freq: Once | INTRAVENOUS | Status: AC
  Administered 2023-05-16: 2 g via INTRAVENOUS
  Filled 2023-05-16: qty 50

## 2023-05-16 NOTE — Progress Notes (Signed)
Progress Note   Patient: Dillon Gonzalez ZOX:[id number] DOB: [birth year] DOA: 05/14/2023     1 DOS: the patient was seen and examined on 05/16/2023   Brief hospital course: 42 y.o. male with medical history significant of heavy alcohol use, OSA not on CPAP, GERD, HTN, anxiety, recent seizure disorder possibly related to alcohol withdrawal who comes in today from the [place] having presented there and found to have a subdural hematoma.  The patient reports a fall on Saturday evening.  He reports tonic-clonic seizure activity on yesterday.  He was instructed to come to the ED following his finding.  In the ED he was found to have a prolonged QT at 600 ms.  He was also found to have significant electrolyte derangements including potassium of 2.4.  His LFTs are  abnormal with AST of 129, ALT of 74, and T. bili of 2.3.  His QT is noted to be quite prolonged.   Assessment and Plan: Subdural hematoma (HCC) Likely subacute.  Patient fell on Saturday.  Had 2 scans several hours apart between here in the [facility] that showed no progression of lesion. ED documentation reviewed. Dr. Danielle Dess of Neurosurgery was consulted in the ED stated no repeat imaging was necessary. Neurosurgery recommended continuing keppra 500mg  bid after 1g load in ED -Keppra has been continued.  -Ordered and reviewed EEG, no evidence of seizures -Ordered and reviewed f/u CT head. Stable without change   Prolonged QT interval Likley secondary to electrolyte abnormalities QTC improved after correcting electrolytes   Hypokalemia Profound at 2.4 at presentation -Improved to 3.5 today -continue to replace as tolerated  Hypomagnesemia -Replaced   Post-traumatic stress disorder, chronic On prazosin usually Holding this due to seizure   Other seizures (HCC) Questionably related to SCh versus alcohol withdrawal versus other (medication) per neurosurgery loaded on Keppra 1000 mg Follow-up outpatient neurology workup regarding begun  with normal EEG and MRI there previously Keppra BID was recommended by Neurosurgery. Continue   Hypocalcemia Replete and trend   Gastro-esophageal reflux disease without esophagitis Continue PPI   Essential (primary) hypertension Continue amlodipine and atenolol. Holding chlorthalidone secondary to hypokalemia    Shortness of breath Usually on albuterol This is held due to prolonged QT   Anxiety Continue BuSpar   Abnormal results of liver function studies Likely related to chronic alcohol use with possible fatty liver as well INR 1.1  Hx ETOH abuse with ETOH withdrawals -cessation done at bedside  -This AM, CIWA noted to be as high as 16 this AM -Continuing CIWA as needed     Subjective: This AM, more confused and trying to get out of bed  Physical Exam: Vitals:   05/15/23 2014 05/16/23 0049 05/16/23 0516 05/16/23 1126  BP: 124/84 120/87 (!) 126/91 (!) 136/96  Pulse: 78 86 86 74  Resp: 18  19 20   Temp: 98.1 F (36.7 C)  98 F (36.7 C) 98.4 F (36.9 C)  TempSrc: Oral  Oral Oral  SpO2: 99% 97% 100% 100%  Weight:      Height:       General exam: Conversant, in no acute distress Respiratory system: normal chest rise, clear, no audible wheezing Cardiovascular system: regular rhythm, s1-s2 Gastrointestinal system: Nondistended, nontender, pos BS Central nervous system: No seizures, no tremors Extremities: No cyanosis, no joint deformities Skin: No rashes, no pallor Psychiatry: Affect normal // no auditory hallucinations   Data Reviewed:  Labs reviewed: Na 131, K 3.5, Cr 0.82  Family Communication: Pt in room, family not  at bedside  Disposition: Status is: Inpatient Remains inpatient appropriate because: Severity of illness  Planned Discharge Destination: Home     Author: Rickey Barbara, MD 05/16/2023 6:18 PM  For on call review [email or web address].

## 2023-05-16 NOTE — Progress Notes (Signed)
EEG complete - results pending 

## 2023-05-16 NOTE — Procedures (Signed)
Patient Name: Dillon Gonzalez  MRN: [id number]  Epilepsy Attending: Charlsie Quest  Referring Physician/Provider: Jerald Kief, MD  Date: 05/16/2023 Duration: 22.28 mins  Patient history: 42 y.o. male with medical history significant of heavy alcohol use, OSA not on CPAP, GERD, HTN, anxiety, recent seizure disorder possibly related to alcohol withdrawal who comes in today from the [place] having presented there and found to have a subdural hematoma.  The patient reports a fall on Saturday evening.  He reports tonic-clonic seizure activity on yesterday. EEG to evaluate for seizure  Level of alertness: Awake, asleep  AEDs during EEG study: LEV, Ativan  Technical aspects: This EEG study was done with scalp electrodes positioned according to the 10-20 International system of electrode placement. Electrical activity was reviewed with band pass filter of 1-70Hz , sensitivity of 7 uV/mm, display speed of 48mm/sec with a 60Hz  notched filter applied as appropriate. EEG data were recorded continuously and digitally stored.  Video monitoring was available and reviewed as appropriate.  Description: The posterior dominant rhythm consists of 8-9 Hz activity of moderate voltage (25-35 uV) seen predominantly in posterior head regions, symmetric and reactive to eye opening and eye closing. Sleep was characterized by vertex waves, sleep spindles (12 to 14 Hz), maximal frontocentral region.  There is an excessive amount of 15 to 18 Hz beta activity symmetrically and diffusely. Physiologic photic driving was not seen during photic stimulation.  Hyperventilation was not performed.     ABNORMALITY - Excessive beta, generalized  IMPRESSION: This study is within normal limits. The excessive beta activity seen in the background is most likely due to the effect of benzodiazepine and is a benign EEG pattern. No seizures or epileptiform discharges were seen throughout the recording.  A normal interictal EEG does not exclude  the diagnosis of epilepsy.  [name]

## 2023-05-17 DIAGNOSIS — S065XAA Traumatic subdural hemorrhage with loss of consciousness status unknown, initial encounter: Secondary | ICD-10-CM | POA: Diagnosis not present

## 2023-05-17 DIAGNOSIS — E876 Hypokalemia: Secondary | ICD-10-CM | POA: Diagnosis not present

## 2023-05-17 DIAGNOSIS — R569 Unspecified convulsions: Secondary | ICD-10-CM | POA: Diagnosis not present

## 2023-05-17 DIAGNOSIS — R9431 Abnormal electrocardiogram [ECG] [EKG]: Secondary | ICD-10-CM | POA: Diagnosis not present

## 2023-05-17 LAB — COMPREHENSIVE METABOLIC PANEL
ALT: 71 U/L — ABNORMAL HIGH (ref 0–44)
AST: 106 U/L — ABNORMAL HIGH (ref 15–41)
Albumin: 3.6 g/dL (ref 3.5–5.0)
Alkaline Phosphatase: 85 U/L (ref 38–126)
Anion gap: 10 (ref 5–15)
BUN: 12 mg/dL (ref 6–20)
CO2: 26 mmol/L (ref 22–32)
Calcium: 9.2 mg/dL (ref 8.9–10.3)
Chloride: 100 mmol/L (ref 98–111)
Creatinine, Ser: 0.77 mg/dL (ref 0.61–1.24)
GFR, Estimated: >60 mL/min (ref >60)
Glucose, Bld: 108 mg/dL — ABNORMAL HIGH (ref 70–99)
Potassium: 4.6 mmol/L (ref 3.5–5.1)
Sodium: 136 mmol/L (ref 135–145)
Total Bilirubin: 0.9 mg/dL (ref 0.3–1.2)
Total Protein: 7.2 g/dL (ref 6.5–8.1)

## 2023-05-17 LAB — MAGNESIUM: Magnesium: 1.4 mg/dL — ABNORMAL LOW (ref 1.7–2.4)

## 2023-05-17 MED ORDER — LEVETIRACETAM 500 MG PO TABS: 500.0000 mg | ORAL_TABLET | Freq: Two times a day (BID) | ORAL | 0 refills | Status: DC

## 2023-05-17 MED ORDER — MAGNESIUM SULFATE 4 GM/100ML IV SOLN
4.0000 g | Freq: Once | INTRAVENOUS | Status: AC
  Administered 2023-05-17: 4 g via INTRAVENOUS
  Filled 2023-05-17: qty 100

## 2023-05-17 MED ORDER — ATENOLOL 50 MG PO TABS: 50.0000 mg | ORAL_TABLET | Freq: Every day | ORAL | 0 refills | Status: AC

## 2023-05-17 NOTE — Discharge Summary (Addendum)
Physician Discharge Summary   Patient: Dillon Gonzalez MRN: [id number] DOB: [birth year]  Admit date:     05/14/2023  Discharge date: 05/17/23  Discharge Physician: Rickey Barbara   PCP: Freada Bergeron, MD   Recommendations at discharge:    Follow up with PCP in 1-2 weeks  Given presenting seizure at presentation, pt was instructed not to drive for 6 months or until cleared by provider  Discharge Diagnoses: Principal Problem:   Subdural hematoma (HCC) Active Problems:   Abnormal results of liver function studies   Anxiety   Shortness of breath   Essential (primary) hypertension   Gastro-esophageal reflux disease without esophagitis   Hypocalcemia   Other seizures (HCC)   Post-traumatic stress disorder, chronic   Hypokalemia   Prolonged QT interval   Alcohol withdrawal (HCC)  Resolved Problems:   * No resolved hospital problems. *  Hospital Course: 42 y.o. male with medical history significant of heavy alcohol use, OSA not on CPAP, GERD, HTN, anxiety, recent seizure disorder possibly related to alcohol withdrawal who comes in today from the [place] having presented there and found to have a subdural hematoma.  The patient reports a fall on Saturday evening.  He reports tonic-clonic seizure activity on yesterday.  He was instructed to come to the ED following his finding.  In the ED he was found to have a prolonged QT at 600 ms.  He was also found to have significant electrolyte derangements including potassium of 2.4.  His LFTs are  abnormal with AST of 129, ALT of 74, and T. bili of 2.3.  His QT is noted to be quite prolonged.   Assessment and Plan: Subdural hematoma (HCC) Likely subacute.  Patient fell on Saturday.  Had 2 scans several hours apart between here in the [facility] that showed no progression of lesion. ED documentation reviewed. Dr. Danielle Dess of Neurosurgery was consulted in the ED stated no repeat imaging was necessary. Neurosurgery recommended continuing keppra 500mg  bid  after 1g load in ED -Keppra was started and continued -Ordered and reviewed EEG, no evidence of seizures -Ordered and reviewed f/u CT head. Stable without change -Pt was instructed to not drive for 6 months or when cleared by his provider   Prolonged QT interval Likley secondary to electrolyte abnormalities QTC improved after correcting electrolytes   Hypokalemia Profound at 2.4 at presentation -Improved to 4.6 at time of d/c   Hypomagnesemia -Replaced   Post-traumatic stress disorder, chronic On prazosin usually Cont on d/c. No drug interaction with keppra noted on formulary review.    Other seizures (HCC) Questionably related to SCh versus alcohol withdrawal versus other (medication) per neurosurgery loaded on Keppra 1000 mg Follow-up outpatient neurology workup regarding begun with normal EEG and MRI there previously Keppra BID was recommended by Neurosurgery. Continue -EEG neg per above -Pt instructed not to drive per above   Hypocalcemia Repleted   Gastro-esophageal reflux disease without esophagitis Continue PPI   Essential (primary) hypertension Continue amlodipine and atenolol. Initially held chlorthalidone secondary to hypokalemia    Shortness of breath Usually on albuterol   Anxiety Continued BuSpar   Abnormal results of liver function studies Likely related to chronic alcohol use with possible fatty liver as well   Hx ETOH abuse with ETOH withdrawals -cessation done at bedside  -This AM, CIWA noted to be as high as 16 this visit -Pt was continued on CIWA protocol -Improved by day of d/c   Consultants: EDP discussed with Neurosurgery Procedures performed: EEG  Disposition: Home  Diet recommendation:  Regular diet DISCHARGE MEDICATION: Allergies as of 05/17/2023       Reactions   Amlodipine Other (See Comments)   Lethargy and fatigue Tiredness- currently taking and tolerating    Atenolol-chlorthalidone Other (See Comments)   Tremors    Hydroxyzine Nausea And Vomiting, Other (See Comments)   Dizziness also (after being on this for a few days, back-to-back)   Lisinopril Nausea And Vomiting, Other (See Comments)   GI Intolerance   Losartan Nausea Only, Swelling, Other (See Comments)   Upset stomach and some swelling   Paroxetine Diarrhea   Sertraline Nausea And Vomiting, Other (See Comments)   GI Intolerance   Telmisartan Swelling, Other (See Comments)   Feet became swollen        Medication List     STOP taking these medications    tiZANidine 4 MG tablet Commonly known as: Zanaflex       TAKE these medications    ACIDOPHILUS LACTOBACILLUS PO Take 1 tablet by mouth daily.   albuterol 108 (90 Base) MCG/ACT inhaler Commonly known as: VENTOLIN HFA Inhale 2 puffs into the lungs every 6 (six) hours as needed for wheezing or shortness of breath.   amLODipine 5 MG tablet Commonly known as: NORVASC Take 5 mg by mouth at bedtime.   atenolol 50 MG tablet Commonly known as: TENORMIN Take 1 tablet (50 mg total) by mouth daily. Start taking on: May 18, 2023   busPIRone 10 MG tablet Commonly known as: BUSPAR Take 5 mg by mouth 2 (two) times daily as needed (for anxiety).   cyclobenzaprine 10 MG tablet Commonly known as: FLEXERIL Take 5 mg by mouth at bedtime as needed for muscle spasms.   dicyclomine 20 MG tablet Commonly known as: BENTYL Take 20 mg by mouth See admin instructions. Take 20 mg by mouth up to three times a day and at bedtime as needed for spasms   esomeprazole 40 MG capsule Commonly known as: NEXIUM Take 40 mg by mouth 2 (two) times daily before a meal.   levETIRAcetam 500 MG tablet Commonly known as: KEPPRA Take 1 tablet (500 mg total) by mouth 2 (two) times daily.   LORazepam 1 MG tablet Commonly known as: Ativan Take 1 tablet (1 mg total) by mouth every 8 (eight) hours as needed for anxiety or seizure.   METAMUCIL PO Take by mouth See admin instructions. Sugar-free formula: Mix  1 tablespoonful of powder into 8 ounces of water or juice and drink twice a day as tolerated- as needed for constipation   mirtazapine 30 MG tablet Commonly known as: REMERON Take 30 mg by mouth at bedtime.   prazosin 1 MG capsule Commonly known as: MINIPRESS Take 2 mg by mouth at bedtime.   Vitamin D3 50 MCG (2000 UT) Tabs Take 2,000 Units by mouth daily.   Voltaren 1 % Gel Generic drug: diclofenac Sodium Apply 2 g topically 4 (four) times daily as needed (to painful sites).        Follow-up Information     Freada Bergeron, MD Follow up in 1 week(s).   Specialty: Infectious Diseases Why: Hospital follow up Contact information: [address] [place]  [place] [address] [phone]                Discharge Exam: Ceasar Mons Weights   05/14/23 1852  Weight: 88.5 kg   General exam: Awake, laying in bed, in nad Respiratory system: Normal respiratory effort, no wheezing Cardiovascular system: regular rate, s1, s2 Gastrointestinal system:  Soft, nondistended, positive BS Central nervous system: CN2-12 grossly intact, strength intact Extremities: Perfused, no clubbing Skin: Normal skin turgor, no notable skin lesions seen Psychiatry: Mood normal // no visual hallucinations   Condition at discharge: fair  The results of significant diagnostics from this hospitalization (including imaging, microbiology, ancillary and laboratory) are listed below for reference.   Imaging Studies: CT HEAD WO CONTRAST ( )  Result Date: 05/16/2023 CLINICAL DATA:  Head trauma EXAM: CT HEAD WITHOUT CONTRAST TECHNIQUE: Contiguous axial images were obtained from the base of the skull through the vertex without intravenous contrast. RADIATION DOSE REDUCTION: This exam was performed according to the departmental dose-optimization program which includes automated exposure control, adjustment of the mA and/or kV according to patient size and/or use of iterative reconstruction technique. COMPARISON:   05/14/2023 FINDINGS: Brain: Redemonstrated small right cerebral convexity subdural hematoma, which measures up to 5 mm, unchanged when remeasured similarly. No new fluid collection. No hydrocephalus. No acute infarct, hemorrhage, mass, mass effect, or midline shift. Vascular: No hyperdense vessel. Skull: Negative for fracture or focal lesion. Sinuses/Orbits: No acute finding. Other: The mastoid air cells are well aerated. IMPRESSION: 1. Unchanged small right cerebral convexity subdural hematoma. No significant mass effect or midline shift. 2. No acute intracranial process. Electronically Signed   By: Wiliam Ke M.D.   On: 05/16/2023 17:19   EEG adult  Result Date: 05/16/2023 Charlsie Quest, MD     05/16/2023  4:07 PM Patient Name: Jakolby Hosek MRN: [id number] Epilepsy Attending: Charlsie Quest Referring Physician/Provider: Jerald Kief, MD Date: 05/16/2023 Duration: 22.28 mins Patient history: 42 y.o. male with medical history significant of heavy alcohol use, OSA not on CPAP, GERD, HTN, anxiety, recent seizure disorder possibly related to alcohol withdrawal who comes in today from the [place] having presented there and found to have a subdural hematoma.  The patient reports a fall on Saturday evening.  He reports tonic-clonic seizure activity on yesterday. EEG to evaluate for seizure Level of alertness: Awake, asleep AEDs during EEG study: LEV, Ativan Technical aspects: This EEG study was done with scalp electrodes positioned according to the 10-20 International system of electrode placement. Electrical activity was reviewed with band pass filter of 1-70Hz , sensitivity of 7 uV/mm, display speed of 58mm/sec with a 60Hz  notched filter applied as appropriate. EEG data were recorded continuously and digitally stored.  Video monitoring was available and reviewed as appropriate. Description: The posterior dominant rhythm consists of 8-9 Hz activity of moderate voltage (25-35 uV) seen predominantly in posterior  head regions, symmetric and reactive to eye opening and eye closing. Sleep was characterized by vertex waves, sleep spindles (12 to 14 Hz), maximal frontocentral region.  There is an excessive amount of 15 to 18 Hz beta activity symmetrically and diffusely. Physiologic photic driving was not seen during photic stimulation.  Hyperventilation was not performed.   ABNORMALITY - Excessive beta, generalized IMPRESSION: This study is within normal limits. The excessive beta activity seen in the background is most likely due to the effect of benzodiazepine and is a benign EEG pattern. No seizures or epileptiform discharges were seen throughout the recording. A normal interictal EEG does not exclude the diagnosis of epilepsy. Priyanka Annabelle Harman   CT HEAD WO CONTRAST ( )  Result Date: 05/14/2023 CLINICAL DATA:  Fall yesterday with recent seizure activity, initial encounter EXAM: CT HEAD WITHOUT CONTRAST TECHNIQUE: Contiguous axial images were obtained from the base of the skull through the vertex without intravenous contrast. RADIATION DOSE REDUCTION: This exam  was performed according to the departmental dose-optimization program which includes automated exposure control, adjustment of the mA and/or kV according to patient size and/or use of iterative reconstruction technique. COMPARISON:  01/29/2022 FINDINGS: Brain: There are changes consistent with a small right-sided subdural hematoma in the parietal region. This measures approximately 4 mm in greatest dimension. No significant midline shift or mass effect is noted. No other areas of hemorrhage are seen. Mild atrophic changes are noted. Vascular: No hyperdense vessel or unexpected calcification. Skull: Normal. Negative for fracture or focal lesion. Sinuses/Orbits: No acute finding. Other: None. IMPRESSION: Small right-sided subdural hematoma measuring 4 mm in greatest dimension. No significant mass effect or midline shift is noted. Critical Value/emergent results were  called by telephone at the time of interpretation on 05/14/2023 at 7:23 pm to Dr. Chaney Malling , who verbally acknowledged these results. Electronically Signed   By: Alcide Clever M.D.   On: 05/14/2023 19:26    Microbiology: No results found for this or any previous visit.  Labs: CBC: Recent Labs  Lab 05/14/23 1928 05/15/23 0448 05/16/23 0456  WBC 8.5 6.7 8.2  NEUTROABS 6.2  --   --   HGB 14.6 13.2 13.4  HCT 40.0 37.0* 38.9*  MCV 93.2 95.9 98.7  PLT 182 163 183   Basic Metabolic Panel: Recent Labs  Lab 05/14/23 1928 05/15/23 0448 05/15/23 1321 05/16/23 0456 05/17/23 0515  NA 132* 132* 136 131* 136  K 2.4* 2.7* 3.0* 3.5 4.6  CL 90* 95* 98 98 100  CO2 26 26 27 24 26   GLUCOSE 111* 104* 105* 102* 108*  BUN 12 10 11 11 12   CREATININE 1.13 0.91 0.87 0.82 0.77  CALCIUM 8.0* 8.0* 8.5* 8.8* 9.2  MG 1.3* 1.9  --  1.6* 1.4*   Liver Function Tests: Recent Labs  Lab 05/14/23 1928 05/16/23 0456 05/17/23 0515  AST 129* 90* 106*  ALT 74* 55* 71*  ALKPHOS 87 84 85  BILITOT 2.3* 1.4* 0.9  PROT 8.1 7.1 7.2  ALBUMIN 4.2 3.5 3.6   CBG: Recent Labs  Lab 05/14/23 1850  GLUCAP 117*    Discharge time spent: less than 30 minutes.  Signed: Rickey Barbara, MD [facility] 05/17/2023

## 2023-05-17 NOTE — Discharge Instructions (Signed)
No driving for 6 months or until cleared by your doctor

## 2023-11-06 DIAGNOSIS — K760 Fatty (change of) liver, not elsewhere classified: Secondary | ICD-10-CM | POA: Diagnosis not present

## 2023-11-06 DIAGNOSIS — R160 Hepatomegaly, not elsewhere classified: Secondary | ICD-10-CM | POA: Diagnosis not present

## 2023-12-04 ENCOUNTER — Other Ambulatory Visit (HOSPITAL_COMMUNITY): Payer: Self-pay | Admitting: Physician Assistant

## 2023-12-04 DIAGNOSIS — R7989 Other specified abnormal findings of blood chemistry: Secondary | ICD-10-CM

## 2023-12-24 ENCOUNTER — Other Ambulatory Visit: Payer: Self-pay | Admitting: Radiology

## 2023-12-24 DIAGNOSIS — R7989 Other specified abnormal findings of blood chemistry: Secondary | ICD-10-CM

## 2023-12-26 NOTE — Progress Notes (Signed)
Chief Complaint: Patient was seen in consultation today for image guided random core liver biopsy  Referring Physician(s): Byrd Hesselbach  Supervising Physician: Mir, Secondary school teacher  Patient Status: [facility] - Out-pt  History of Present Illness: Dillon Gonzalez is a 42 y.o. male with PMH sign for anxiety, HTN, heavy alcohol use, PTSD, OSA not on CPAP, GERD, recent seizure disorder , prior subdural hematoma who presents now with elevated LFT's, +ANA and increased hepatic steatosis on imaging. He is scheduled today for US guided random core liver biopsy for further evaluation.   Past Medical History:  Diagnosis Date   Anxiety    Back pain    Hypertension    Knee pain     Past Surgical History:  Procedure Laterality Date   BACK SURGERY     KNEE SURGERY      Allergies: Amlodipine, Atenolol-chlorthalidone, Hydroxyzine, Lisinopril, Losartan, Paroxetine, Sertraline, and Telmisartan  Medications: Prior to Admission medications   Medication Sig Start Date End Date Taking? Authorizing Provider  ACIDOPHILUS LACTOBACILLUS PO Take 1 tablet by mouth daily.    [provider]  albuterol (VENTOLIN HFA) 108 (90 Base) MCG/ACT inhaler Inhale 2 puffs into the lungs every 6 (six) hours as needed for wheezing or shortness of breath.    [provider]  amLODipine (NORVASC) 5 MG tablet Take 5 mg by mouth at bedtime.    [provider]  atenolol (TENORMIN) 50 MG tablet Take 1 tablet (50 mg total) by mouth daily. 05/18/23   Jerald Kief, MD  busPIRone (BUSPAR) 10 MG tablet Take 5 mg by mouth 2 (two) times daily as needed (for anxiety).    [provider]  Cholecalciferol (VITAMIN D3) 50 MCG (2000 UT) TABS Take 2,000 Units by mouth daily.    [provider]  cyclobenzaprine (FLEXERIL) 10 MG tablet Take 5 mg by mouth at bedtime as needed for muscle spasms.    [provider]  diclofenac Sodium (VOLTAREN) 1 % GEL Apply 2 g topically 4 (four) times daily  as needed (to painful sites).    [provider]  dicyclomine (BENTYL) 20 MG tablet Take 20 mg by mouth See admin instructions. Take 20 mg by mouth up to three times a day and at bedtime as needed for spasms    [provider]  esomeprazole (NEXIUM) 40 MG capsule Take 40 mg by mouth 2 (two) times daily before a meal.    [provider]  levETIRAcetam (KEPPRA) 500 MG tablet Take 1 tablet (500 mg total) by mouth 2 (two) times daily. 05/17/23 06/16/23  Jerald Kief, MD  LORazepam (ATIVAN) 1 MG tablet Take 1 tablet (1 mg total) by mouth every 8 (eight) hours as needed for anxiety or seizure. 01/29/22   Benjiman Core, MD  mirtazapine (REMERON) 30 MG tablet Take 30 mg by mouth at bedtime.    [provider]  prazosin (MINIPRESS) 1 MG capsule Take 2 mg by mouth at bedtime.    [provider]  Psyllium (METAMUCIL PO) Take by mouth See admin instructions. Sugar-free formula: Mix 1 tablespoonful of powder into 8 ounces of water or juice and drink twice a day as tolerated- as needed for constipation    [provider]     No family history on file.  Social History   Socioeconomic History   Marital status: Significant Other    Spouse name: Not on file   Number of children: Not on file   Years of education: Not on file  Highest education level: Not on file  Occupational History   Not on file  Tobacco Use   Smoking status: Former   Smokeless tobacco: Not on file  Vaping Use   Vaping status: Former  Substance and Sexual Activity   Alcohol use: Yes   Drug use: Never   Sexual activity: Not on file  Other Topics Concern   Not on file  Social History Narrative   Right handed    Social Drivers of Health   Financial Resource Strain: Not on file  Food Insecurity: No Food Insecurity (05/15/2023)   Hunger Vital Sign    Worried About Running Out of Food in the Last Year: Never true    Ran Out of Food in the Last Year: Never true   Transportation Needs: No Transportation Needs (05/15/2023)   PRAPARE - Administrator, Civil Service (Medical): No    Lack of Transportation (Non-Medical): No  Physical Activity: Not on file  Stress: Not on file  Social Connections: Unknown (05/08/2022)   Received from [facility], [facility]   Social Network    Social Network: Not on file      Review of Systems denies fever,HA,CP,dyspnea, cough, abd  pain, back pain,N/V or bleeding  Vital Signs: Vitals:   12/27/23 1122  BP: (!) 139/96  Pulse: 70  Resp: 16  Temp: 99.1 F (37.3 C)  SpO2: 96%      Code Status: FULL CODE    Physical Exam: awake/alert; chest- CTA bilat; heart- RRR; abd-soft,+BS, NT; no LE edema  Imaging: No results found.  Labs:  CBC: Recent Labs    05/14/23 1928 05/15/23 0448 05/16/23 0456  WBC 8.5 6.7 8.2  HGB 14.6 13.2 13.4  HCT 40.0 37.0* 38.9*  PLT 182 163 183    COAGS: Recent Labs    05/15/23 0448  INR 1.1    BMP: Recent Labs    05/15/23 0448 05/15/23 1321 05/16/23 0456 05/17/23 0515  NA 132* 136 131* 136  K 2.7* 3.0* 3.5 4.6  CL 95* 98 98 100  CO2 26 27 24 26   GLUCOSE 104* 105* 102* 108*  BUN 10 11 11 12   CALCIUM 8.0* 8.5* 8.8* 9.2  CREATININE 0.91 0.87 0.82 0.77  GFRNONAA >60 >60 >60 >60    LIVER FUNCTION TESTS: Recent Labs    05/14/23 1928 05/16/23 0456 05/17/23 0515  BILITOT 2.3* 1.4* 0.9  AST 129* 90* 106*  ALT 74* 55* 71*  ALKPHOS 87 84 85  PROT 8.1 7.1 7.2  ALBUMIN 4.2 3.5 3.6    TUMOR MARKERS: No results for input(s): "AFPTM", "CEA", "CA199", "CHROMGRNA" in the last 8760 hours.  Assessment and Plan: 42 y.o. male with PMH sign for anxiety, HTN, heavy alcohol use, PTSD, OSA not on CPAP, GERD, recent seizure disorder , prior subdural hematoma who presents now with elevated LFT's, +ANA and increased hepatic steatosis on imaging. He is scheduled today for US guided random core liver biopsy for further evaluation. Risks and benefits of  procedure was discussed with the patient  including, but not limited to bleeding, infection, damage to adjacent structures or low yield requiring additional tests.  All of the questions were answered and there is agreement to proceed.  Consent signed and in chart.    Thank you for this interesting consult.  I greatly enjoyed meeting Fielder Gengler and look forward to participating in their care.  A copy of this report was sent to the requesting provider on this date.  Electronically Signed: D. Jeananne Rama, PA-C 12/26/2023, 10:42 AM   I spent a total of 25 minutes    in face to face in clinical consultation, greater than 50% of which was counseling/coordinating care for image guided random core liver biopsy

## 2023-12-27 ENCOUNTER — Ambulatory Visit (HOSPITAL_COMMUNITY)
Admission: RE | Admit: 2023-12-27 | Discharge: 2023-12-27 | Disposition: A | Discharge status: Home / Self Care | Payer: No Typology Code available for payment source | Source: Ambulatory Visit | Attending: Infectious Diseases | Admitting: Infectious Diseases

## 2023-12-27 ENCOUNTER — Ambulatory Visit (HOSPITAL_COMMUNITY)
Admission: RE | Admit: 2023-12-27 | Discharge: 2023-12-27 | Disposition: A | Discharge status: Home / Self Care | Payer: No Typology Code available for payment source | Source: Ambulatory Visit | Attending: Physician Assistant | Admitting: Physician Assistant

## 2023-12-27 ENCOUNTER — Encounter (HOSPITAL_COMMUNITY): Payer: Self-pay

## 2023-12-27 DIAGNOSIS — K219 Gastro-esophageal reflux disease without esophagitis: Secondary | ICD-10-CM | POA: Insufficient documentation

## 2023-12-27 DIAGNOSIS — Z87820 Personal history of traumatic brain injury: Secondary | ICD-10-CM | POA: Insufficient documentation

## 2023-12-27 DIAGNOSIS — G40909 Epilepsy, unspecified, not intractable, without status epilepticus: Secondary | ICD-10-CM | POA: Insufficient documentation

## 2023-12-27 DIAGNOSIS — R7989 Other specified abnormal findings of blood chemistry: Secondary | ICD-10-CM

## 2023-12-27 DIAGNOSIS — K7581 Nonalcoholic steatohepatitis (NASH): Secondary | ICD-10-CM | POA: Insufficient documentation

## 2023-12-27 DIAGNOSIS — K7401 Hepatic fibrosis, early fibrosis: Secondary | ICD-10-CM | POA: Insufficient documentation

## 2023-12-27 DIAGNOSIS — G4733 Obstructive sleep apnea (adult) (pediatric): Secondary | ICD-10-CM | POA: Insufficient documentation

## 2023-12-27 DIAGNOSIS — I10 Essential (primary) hypertension: Secondary | ICD-10-CM | POA: Insufficient documentation

## 2023-12-27 DIAGNOSIS — F419 Anxiety disorder, unspecified: Secondary | ICD-10-CM | POA: Diagnosis not present

## 2023-12-27 DIAGNOSIS — F4310 Post-traumatic stress disorder, unspecified: Secondary | ICD-10-CM | POA: Diagnosis not present

## 2023-12-27 DIAGNOSIS — Z87891 Personal history of nicotine dependence: Secondary | ICD-10-CM | POA: Insufficient documentation

## 2023-12-27 LAB — CBC WITH DIFFERENTIAL/PLATELET
Abs Immature Granulocytes: 0.22 10*3/uL — ABNORMAL HIGH (ref 0.00–0.07)
Basophils Absolute: 0.1 10*3/uL (ref 0.0–0.1)
Basophils Relative: 1 %
Eosinophils Absolute: 0.1 10*3/uL (ref 0.0–0.5)
Eosinophils Relative: 1 %
HCT: 39.9 % (ref 39.0–52.0)
Hemoglobin: 14.2 g/dL (ref 13.0–17.0)
Immature Granulocytes: 2 %
Lymphocytes Relative: 8 %
Lymphs Abs: 0.9 10*3/uL (ref 0.7–4.0)
MCH: 35.8 pg — ABNORMAL HIGH (ref 26.0–34.0)
MCHC: 35.6 g/dL (ref 30.0–36.0)
MCV: 100.5 fL — ABNORMAL HIGH (ref 80.0–100.0)
Monocytes Absolute: 0.5 10*3/uL (ref 0.1–1.0)
Monocytes Relative: 4 %
Neutro Abs: 9.7 10*3/uL — ABNORMAL HIGH (ref 1.7–7.7)
Neutrophils Relative %: 84 %
Platelets: 258 10*3/uL (ref 150–400)
RBC: 3.97 MIL/uL — ABNORMAL LOW (ref 4.22–5.81)
RDW: 12.2 % (ref 11.5–15.5)
WBC: 11.6 10*3/uL — ABNORMAL HIGH (ref 4.0–10.5)
nRBC: 0 % (ref 0.0–0.2)

## 2023-12-27 LAB — COMPREHENSIVE METABOLIC PANEL
ALT: 161 U/L — ABNORMAL HIGH (ref 0–44)
AST: 471 U/L — ABNORMAL HIGH (ref 15–41)
Albumin: 3.7 g/dL (ref 3.5–5.0)
Alkaline Phosphatase: 213 U/L — ABNORMAL HIGH (ref 38–126)
Anion gap: 13 (ref 5–15)
BUN: 9 mg/dL (ref 6–20)
CO2: 23 mmol/L (ref 22–32)
Calcium: 8.8 mg/dL — ABNORMAL LOW (ref 8.9–10.3)
Chloride: 99 mmol/L (ref 98–111)
Creatinine, Ser: 0.79 mg/dL (ref 0.61–1.24)
GFR, Estimated: >60 mL/min (ref >60)
Glucose, Bld: 100 mg/dL — ABNORMAL HIGH (ref 70–99)
Potassium: 3.6 mmol/L (ref 3.5–5.1)
Sodium: 135 mmol/L (ref 135–145)
Total Bilirubin: 1.6 mg/dL — ABNORMAL HIGH (ref <1.2)
Total Protein: 7.5 g/dL (ref 6.5–8.1)

## 2023-12-27 LAB — PROTIME-INR
INR: 1.1 (ref 0.8–1.2)
Prothrombin Time: 14.6 s (ref 11.4–15.2)

## 2023-12-27 MED ORDER — FENTANYL CITRATE (PF) 100 MCG/2ML IJ SOLN
INTRAMUSCULAR | Status: AC | PRN
  Administered 2023-12-27: 50 ug via INTRAVENOUS

## 2023-12-27 MED ORDER — MIDAZOLAM HCL 2 MG/2ML IJ SOLN
INTRAMUSCULAR | Status: AC
  Filled 2023-12-27: qty 2

## 2023-12-27 MED ORDER — MIDAZOLAM HCL 2 MG/2ML IJ SOLN
INTRAMUSCULAR | Status: AC | PRN
  Administered 2023-12-27: 1 mg via INTRAVENOUS

## 2023-12-27 MED ORDER — GELATIN ABSORBABLE 12-7 MM EX MISC
CUTANEOUS | Status: AC
  Filled 2023-12-27: qty 1

## 2023-12-27 MED ORDER — FENTANYL CITRATE (PF) 100 MCG/2ML IJ SOLN
INTRAMUSCULAR | Status: AC
  Filled 2023-12-27: qty 2

## 2023-12-27 MED ORDER — LIDOCAINE HCL 1 % IJ SOLN
INTRAMUSCULAR | Status: AC
  Filled 2023-12-27: qty 20

## 2023-12-27 MED ORDER — SODIUM CHLORIDE 0.9 % IV SOLN: INTRAVENOUS | Status: DC

## 2023-12-27 NOTE — Procedures (Signed)
Interventional Radiology Procedure Note  Procedure: US guided random liver biopsy  Indication: Elevated liver function   Findings: Please refer to procedural dictation for full description.  Complications: None  EBL: < 10 mL  Acquanetta Belling, MD [phone]

## 2023-12-30 LAB — SURGICAL PATHOLOGY

## 2024-06-09 ENCOUNTER — Emergency Department (HOSPITAL_COMMUNITY)

## 2024-06-09 ENCOUNTER — Other Ambulatory Visit: Payer: Self-pay

## 2024-06-09 ENCOUNTER — Inpatient Hospital Stay (HOSPITAL_COMMUNITY)
Admission: EM | Admit: 2024-06-09 | Discharge: 2024-06-11 | DRG: 432 | Disposition: A | Discharge status: Home / Self Care | Attending: Family Medicine | Admitting: Family Medicine

## 2024-06-09 ENCOUNTER — Encounter (HOSPITAL_COMMUNITY): Payer: Self-pay

## 2024-06-09 DIAGNOSIS — K7460 Unspecified cirrhosis of liver: Secondary | ICD-10-CM

## 2024-06-09 DIAGNOSIS — F4312 Post-traumatic stress disorder, chronic: Secondary | ICD-10-CM

## 2024-06-09 DIAGNOSIS — K859 Acute pancreatitis without necrosis or infection, unspecified: Secondary | ICD-10-CM | POA: Diagnosis present

## 2024-06-09 DIAGNOSIS — E871 Hypo-osmolality and hyponatremia: Secondary | ICD-10-CM

## 2024-06-09 DIAGNOSIS — R7989 Other specified abnormal findings of blood chemistry: Secondary | ICD-10-CM

## 2024-06-09 DIAGNOSIS — R296 Repeated falls: Secondary | ICD-10-CM | POA: Diagnosis present

## 2024-06-09 DIAGNOSIS — N179 Acute kidney failure, unspecified: Secondary | ICD-10-CM | POA: Diagnosis not present

## 2024-06-09 DIAGNOSIS — D693 Immune thrombocytopenic purpura: Secondary | ICD-10-CM | POA: Diagnosis present

## 2024-06-09 DIAGNOSIS — F10939 Alcohol use, unspecified with withdrawal, unspecified: Secondary | ICD-10-CM | POA: Diagnosis present

## 2024-06-09 DIAGNOSIS — E870 Hyperosmolality and hypernatremia: Secondary | ICD-10-CM

## 2024-06-09 DIAGNOSIS — G40909 Epilepsy, unspecified, not intractable, without status epilepticus: Secondary | ICD-10-CM | POA: Diagnosis present

## 2024-06-09 DIAGNOSIS — K7030 Alcoholic cirrhosis of liver without ascites: Principal | ICD-10-CM | POA: Diagnosis present

## 2024-06-09 DIAGNOSIS — R0789 Other chest pain: Secondary | ICD-10-CM

## 2024-06-09 DIAGNOSIS — R9431 Abnormal electrocardiogram [ECG] [EKG]: Secondary | ICD-10-CM

## 2024-06-09 DIAGNOSIS — G4733 Obstructive sleep apnea (adult) (pediatric): Secondary | ICD-10-CM | POA: Diagnosis present

## 2024-06-09 DIAGNOSIS — Z87898 Personal history of other specified conditions: Secondary | ICD-10-CM

## 2024-06-09 DIAGNOSIS — R1013 Epigastric pain: Secondary | ICD-10-CM | POA: Diagnosis not present

## 2024-06-09 DIAGNOSIS — I4510 Unspecified right bundle-branch block: Secondary | ICD-10-CM | POA: Diagnosis present

## 2024-06-09 DIAGNOSIS — I10 Essential (primary) hypertension: Secondary | ICD-10-CM | POA: Diagnosis present

## 2024-06-09 DIAGNOSIS — F10930 Alcohol use, unspecified with withdrawal, uncomplicated: Secondary | ICD-10-CM | POA: Diagnosis not present

## 2024-06-09 DIAGNOSIS — R7401 Elevation of levels of liver transaminase levels: Secondary | ICD-10-CM

## 2024-06-09 DIAGNOSIS — E876 Hypokalemia: Secondary | ICD-10-CM | POA: Diagnosis present

## 2024-06-09 DIAGNOSIS — R748 Abnormal levels of other serum enzymes: Secondary | ICD-10-CM

## 2024-06-09 DIAGNOSIS — D696 Thrombocytopenia, unspecified: Secondary | ICD-10-CM

## 2024-06-09 DIAGNOSIS — Z87891 Personal history of nicotine dependence: Secondary | ICD-10-CM

## 2024-06-09 DIAGNOSIS — K852 Alcohol induced acute pancreatitis without necrosis or infection: Secondary | ICD-10-CM | POA: Diagnosis present

## 2024-06-09 DIAGNOSIS — F4320 Adjustment disorder, unspecified: Secondary | ICD-10-CM | POA: Diagnosis present

## 2024-06-09 DIAGNOSIS — E860 Dehydration: Secondary | ICD-10-CM | POA: Diagnosis present

## 2024-06-09 DIAGNOSIS — F10139 Alcohol abuse with withdrawal, unspecified: Secondary | ICD-10-CM | POA: Diagnosis present

## 2024-06-09 DIAGNOSIS — Z8679 Personal history of other diseases of the circulatory system: Secondary | ICD-10-CM

## 2024-06-09 DIAGNOSIS — R079 Chest pain, unspecified: Secondary | ICD-10-CM | POA: Insufficient documentation

## 2024-06-09 DIAGNOSIS — R112 Nausea with vomiting, unspecified: Principal | ICD-10-CM

## 2024-06-09 DIAGNOSIS — F411 Generalized anxiety disorder: Secondary | ICD-10-CM | POA: Diagnosis present

## 2024-06-09 DIAGNOSIS — K760 Fatty (change of) liver, not elsewhere classified: Secondary | ICD-10-CM | POA: Diagnosis present

## 2024-06-09 DIAGNOSIS — Z79899 Other long term (current) drug therapy: Secondary | ICD-10-CM

## 2024-06-09 DIAGNOSIS — S065XAA Traumatic subdural hemorrhage with loss of consciousness status unknown, initial encounter: Secondary | ICD-10-CM

## 2024-06-09 LAB — CBC WITH DIFFERENTIAL/PLATELET
Abs Immature Granulocytes: 0.15 10*3/uL — ABNORMAL HIGH (ref 0.00–0.07)
Basophils Absolute: 0.1 10*3/uL (ref 0.0–0.1)
Basophils Relative: 1 %
Eosinophils Absolute: 0.1 10*3/uL (ref 0.0–0.5)
Eosinophils Relative: 1 %
HCT: 42.8 % (ref 39.0–52.0)
Hemoglobin: 15.3 g/dL (ref 13.0–17.0)
Immature Granulocytes: 2 %
Lymphocytes Relative: 10 %
Lymphs Abs: 0.8 10*3/uL (ref 0.7–4.0)
MCH: 33.3 pg (ref 26.0–34.0)
MCHC: 35.7 g/dL (ref 30.0–36.0)
MCV: 93.2 fL (ref 80.0–100.0)
Monocytes Absolute: 0.5 10*3/uL (ref 0.1–1.0)
Monocytes Relative: 6 %
Neutro Abs: 6.6 10*3/uL (ref 1.7–7.7)
Neutrophils Relative %: 80 %
Platelets: 93 10*3/uL — ABNORMAL LOW (ref 150–400)
RBC: 4.59 MIL/uL (ref 4.22–5.81)
RDW: 13.5 % (ref 11.5–15.5)
WBC: 8.2 10*3/uL (ref 4.0–10.5)
nRBC: 0 % (ref 0.0–0.2)

## 2024-06-09 LAB — I-STAT CHEM 8, ED
BUN: 21 mg/dL — ABNORMAL HIGH (ref 6–20)
Calcium, Ion: 0.97 mmol/L — ABNORMAL LOW (ref 1.15–1.40)
Chloride: 85 mmol/L — ABNORMAL LOW (ref 98–111)
Creatinine, Ser: 1.1 mg/dL (ref 0.61–1.24)
Glucose, Bld: 87 mg/dL (ref 70–99)
HCT: 49 % (ref 39.0–52.0)
Hemoglobin: 16.7 g/dL (ref 13.0–17.0)
Potassium: 3.4 mmol/L — ABNORMAL LOW (ref 3.5–5.1)
Sodium: 131 mmol/L — ABNORMAL LOW (ref 135–145)
TCO2: 27 mmol/L (ref 22–32)

## 2024-06-09 LAB — COMPREHENSIVE METABOLIC PANEL WITH GFR
ALT: 215 U/L — ABNORMAL HIGH (ref 0–44)
AST: 522 U/L — ABNORMAL HIGH (ref 15–41)
Albumin: 4.3 g/dL (ref 3.5–5.0)
Alkaline Phosphatase: 232 U/L — ABNORMAL HIGH (ref 38–126)
Anion gap: 22 — ABNORMAL HIGH (ref 5–15)
BUN: 18 mg/dL (ref 6–20)
CO2: 26 mmol/L (ref 22–32)
Calcium: 9.4 mg/dL (ref 8.9–10.3)
Chloride: 84 mmol/L — ABNORMAL LOW (ref 98–111)
Creatinine, Ser: 1.48 mg/dL — ABNORMAL HIGH (ref 0.61–1.24)
GFR, Estimated: >60 mL/min (ref >60)
Glucose, Bld: 84 mg/dL (ref 70–99)
Potassium: 3.6 mmol/L (ref 3.5–5.1)
Sodium: 132 mmol/L — ABNORMAL LOW (ref 135–145)
Total Bilirubin: 4.6 mg/dL — ABNORMAL HIGH (ref 0.0–1.2)
Total Protein: 7.8 g/dL (ref 6.5–8.1)

## 2024-06-09 LAB — CK: Total CK: 144 U/L (ref 49–397)

## 2024-06-09 LAB — LIPASE, BLOOD: Lipase: 151 U/L — ABNORMAL HIGH (ref 11–51)

## 2024-06-09 LAB — MAGNESIUM: Magnesium: 1.3 mg/dL — ABNORMAL LOW (ref 1.7–2.4)

## 2024-06-09 LAB — ETHANOL: Alcohol, Ethyl (B): <15 mg/dL (ref <15)

## 2024-06-09 LAB — TROPONIN I (HIGH SENSITIVITY)
Troponin I (High Sensitivity): 5 ng/L (ref <18)
Troponin I (High Sensitivity): 6 ng/L (ref <18)

## 2024-06-09 MED ORDER — LEVETIRACETAM 500 MG PO TABS: 500.0000 mg | ORAL_TABLET | Freq: Two times a day (BID) | ORAL | Status: DC

## 2024-06-09 MED ORDER — TRIMETHOBENZAMIDE HCL 100 MG/ML IM SOLN: 200.0000 mg | Freq: Four times a day (QID) | INTRAMUSCULAR | Status: DC | PRN

## 2024-06-09 MED ORDER — ONDANSETRON HCL 4 MG/2ML IJ SOLN
4.0000 mg | Freq: Once | INTRAMUSCULAR | Status: AC
  Administered 2024-06-09: 4 mg via INTRAVENOUS
  Filled 2024-06-09: qty 2

## 2024-06-09 MED ORDER — LORAZEPAM 2 MG/ML IJ SOLN
1.0000 mg | Freq: Once | INTRAMUSCULAR | Status: AC
  Administered 2024-06-09: 1 mg via INTRAVENOUS
  Filled 2024-06-09: qty 1

## 2024-06-09 MED ORDER — THIAMINE MONONITRATE 100 MG PO TABS
100.0000 mg | ORAL_TABLET | Freq: Every day | ORAL | Status: DC
  Administered 2024-06-10 – 2024-06-11 (×2): 100 mg via ORAL
  Filled 2024-06-09 (×2): qty 1

## 2024-06-09 MED ORDER — THIAMINE HCL 100 MG/ML IJ SOLN: 100.0000 mg | Freq: Every day | INTRAMUSCULAR | Status: DC

## 2024-06-09 MED ORDER — SODIUM CHLORIDE 0.9% FLUSH
3.0000 mL | Freq: Two times a day (BID) | INTRAVENOUS | Status: DC
  Administered 2024-06-10 – 2024-06-11 (×2): 3 mL via INTRAVENOUS

## 2024-06-09 MED ORDER — HYDROMORPHONE HCL 1 MG/ML IJ SOLN: 0.5000 mg | INTRAMUSCULAR | Status: DC | PRN

## 2024-06-09 MED ORDER — LORAZEPAM 2 MG/ML IJ SOLN: 1.0000 mg | INTRAMUSCULAR | Status: DC | PRN

## 2024-06-09 MED ORDER — SODIUM CHLORIDE 0.9 % IV SOLN: INTRAVENOUS | Status: AC

## 2024-06-09 MED ORDER — SODIUM CHLORIDE 0.9 % IV SOLN: 250.0000 mL | INTRAVENOUS | Status: AC | PRN

## 2024-06-09 MED ORDER — SODIUM CHLORIDE 0.9% FLUSH: 3.0000 mL | INTRAVENOUS | Status: DC | PRN

## 2024-06-09 MED ORDER — FOLIC ACID 1 MG PO TABS
1.0000 mg | ORAL_TABLET | Freq: Every day | ORAL | Status: DC
Start: 2024-06-10 — End: 2024-06-11
  Administered 2024-06-10 – 2024-06-11 (×2): 1 mg via ORAL
  Filled 2024-06-09 (×2): qty 1

## 2024-06-09 MED ORDER — SODIUM CHLORIDE 0.9 % IV BOLUS
1000.0000 mL | Freq: Once | INTRAVENOUS | Status: AC
  Administered 2024-06-09: 1000 mL via INTRAVENOUS

## 2024-06-09 MED ORDER — SODIUM CHLORIDE 0.9% FLUSH
3.0000 mL | Freq: Two times a day (BID) | INTRAVENOUS | Status: DC
  Administered 2024-06-10: 3 mL via INTRAVENOUS

## 2024-06-09 MED ORDER — LEVETIRACETAM (KEPPRA) 500 MG/5 ML ADULT IV PUSH
500.0000 mg | Freq: Two times a day (BID) | INTRAVENOUS | Status: DC
  Administered 2024-06-10: 500 mg via INTRAVENOUS
  Filled 2024-06-09 (×2): qty 5

## 2024-06-09 MED ORDER — MORPHINE SULFATE (PF) 4 MG/ML IV SOLN
4.0000 mg | Freq: Once | INTRAVENOUS | Status: AC
  Administered 2024-06-09: 4 mg via INTRAVENOUS
  Filled 2024-06-09: qty 1

## 2024-06-09 MED ORDER — ADULT MULTIVITAMIN W/MINERALS CH
1.0000 | ORAL_TABLET | Freq: Every day | ORAL | Status: DC
Start: 2024-06-10 — End: 2024-06-11
  Administered 2024-06-10 – 2024-06-11 (×2): 1 via ORAL
  Filled 2024-06-09 (×2): qty 1

## 2024-06-09 MED ORDER — MAGNESIUM SULFATE 2 GM/50ML IV SOLN
2.0000 g | Freq: Once | INTRAVENOUS | Status: AC
  Administered 2024-06-09: 2 g via INTRAVENOUS
  Filled 2024-06-09: qty 50

## 2024-06-09 MED ORDER — IOHEXOL 350 MG/ML SOLN
100.0000 mL | Freq: Once | INTRAVENOUS | Status: AC | PRN
  Administered 2024-06-09: 100 mL via INTRAVENOUS

## 2024-06-09 MED ORDER — LORAZEPAM 1 MG PO TABS: 1.0000 mg | ORAL_TABLET | ORAL | Status: DC | PRN

## 2024-06-09 MED ORDER — LACTATED RINGERS IV SOLN: INTRAVENOUS | Status: DC

## 2024-06-09 NOTE — H&P (Addendum)
 History and Physical    Dillon Gonzalez ZOX:[id number] DOB: [birth year] DOA: 06/09/2024  PCP: Lorelei Rogers, MD   Patient coming from: Home   Chief Complaint:  Chief Complaint  Patient presents with   Abdominal Pain   Chest Pain   Nausea   Emesis   ED TRIAGE note:  Pt BIBEMS from home came to ED c/o abdominal pain with nausea and vomiting x 2-3 days,  mentioned of some chest pain      HPI:  Dillon Gonzalez is a 43 y.o. 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 Patient reported lower central chest pain abdominal pain that radiates to the back.States he occasionally drinks alcohol 1-2 times a month. Does not use NSAIDs. Does occasionally take Tylenol. No known specific enticing events. Has been unable to keep down p.o. intake over the last 3 days. Chest pain does not radiate to left arm or jaw. No associated shortness of breath. Has a mild nonproductive cough occasionally is had some blood tinged sputum after "coughing hard." Denies any prior history of esophageal varices. He is not anticoagulated. No dysuria, hematuria, bloody stool.   Patient reported that before coming to the hospital 6-hour ago he had couple of drinks of drinks.  Reported persistent nausea, vomiting midepigastric abdominal pain.   ED Course:  At presentation to ED patient is hemodynamically stable. Troponin within normal range 5. EKG showed normal sinus rhythm heart rate 77, prolonged QTc 581 ms and atypical right bundle branch block.  CBC showing low platelet count 93 otherwise  unremarkable. CMP showing low sodium 132, low chloride 84, elevated creatinine 1.48, chronic transaminitis, elevated anion gap 22. Elevated lipase level 151. Low back level 1.3. Normal CK level. Blood alcohol level in process.  CT angio chest abdomen pelvis no acute aortic aneurysm, dissection, pulmonary embolism.  No acute chest, abdominal or pelvic finding.  Hepatic steatosis.  In the ED patient has been given Ativan , mag sulfate 2 g, morphine, Zofran 4 mg and 2 L of NS bolus. ED physician reported that patient now saying he is has been intermittently drinking alcohol.  However continues to have abdominal pain with associated nausea vomiting and poor oral intake.  Patient is also diaphoretic and tachycardic concern for alcohol withdrawal.  Hospitalist has been consulted for further evaluation management of intractable nausea vomiting, acute kidney injury in the setting of dehydration, hyponatremia, hypomagnesemia, concern for development of early pancreatitis and chronic hepatic cirrhosis.  Need to monitor for alcohol withdrawal as well.   Significant labs in the ED: Lab Orders         CBC with Differential         Comprehensive metabolic panel         Lipase, blood         Magnesium          Ethanol         CK         Hepatitis panel, acute         Comprehensive metabolic panel         CBC         APTT         Protime-INR  Urinalysis, Routine w reflex microscopic -Urine, Clean Catch         Lipase, blood         HIV Antibody (routine testing w rflx)         I-stat chem 8, ED (not at [facility], [facility] or [facility])       Review of Systems:  Review of Systems  Constitutional:  Negative for chills, fever, malaise/fatigue and weight loss.  Respiratory:  Negative for cough, sputum production and shortness of breath.   Cardiovascular:  Negative for chest pain, palpitations and leg swelling.  Gastrointestinal:  Positive for abdominal pain, nausea and vomiting. Negative for blood in stool,  constipation and heartburn.  Musculoskeletal:  Negative for back pain, joint pain, myalgias and neck pain.  Neurological:  Positive for tremors. Negative for dizziness, sensory change, speech change, focal weakness, seizures, loss of consciousness, weakness and headaches.  Psychiatric/Behavioral:  The patient is not nervous/anxious.     Past Medical History:  Diagnosis Date   Anxiety    Back pain    Hypertension    Knee pain     Past Surgical History:  Procedure Laterality Date   BACK SURGERY     KNEE SURGERY       reports that he has quit smoking. He does not have any smokeless tobacco history on file. He reports current alcohol use. He reports that he does not use drugs.  Allergies  Allergen Reactions   Levetiracetam  Other (See Comments)    Anger related issues; bad reactions with other medications he was prescribed   Mirtazapine Other (See Comments)    Vertigo; elevated heart rate; severe nosebleeds   Atenolol -Chlorthalidone Other (See Comments)    Tremors   Losartan Nausea Only, Swelling and Other (See Comments)    Upset stomach and some swelling   Telmisartan Swelling and Other (See Comments)    Feet became swollen   Amlodipine  Other (See Comments)    Lethargy and fatigue  Tiredness- currently taking and tolerating   Hydroxyzine Nausea And Vomiting and Other (See Comments)    Dizziness also (after being on this for a few days, back-to-back)   Lisinopril Nausea And Vomiting and Other (See Comments)    GI Intolerance   Paroxetine Diarrhea   Sertraline Nausea And Vomiting and Other (See Comments)    GI Intolerance    History reviewed. No pertinent family history.  Prior to Admission medications   Medication Sig Start Date End Date Taking? Authorizing Provider  ACIDOPHILUS LACTOBACILLUS PO Take 1 tablet by mouth daily.    [provider]  albuterol (VENTOLIN HFA) 108 (90 Base) MCG/ACT inhaler Inhale 2 puffs into the lungs every 6 (six) hours as needed for  wheezing or shortness of breath.    [provider]  amLODipine  (NORVASC ) 5 MG tablet Take 5 mg by mouth at bedtime.    [provider]  atenolol  (TENORMIN ) 50 MG tablet Take 1 tablet (50 mg total) by mouth daily. 05/18/23   Oral Billings, MD  busPIRone  (BUSPAR ) 10 MG tablet Take 5 mg by mouth 2 (two) times daily as needed (for anxiety).    [provider]  Cholecalciferol (VITAMIN D3) 50 MCG (2000 UT) TABS Take 2,000 Units by mouth daily.    [provider]  cyclobenzaprine (FLEXERIL) 10 MG tablet Take 5 mg by mouth at bedtime as needed for muscle spasms.    [provider]  diclofenac Sodium (VOLTAREN) 1 % GEL Apply 2 g topically 4 (four)  times daily as needed (to painful sites).    [provider]  dicyclomine (BENTYL) 20 MG tablet Take 20 mg by mouth See admin instructions. Take 20 mg by mouth up to three times a day and at bedtime as needed for spasms    [provider]  esomeprazole (NEXIUM) 40 MG capsule Take 40 mg by mouth 2 (two) times daily before a meal.    [provider]  levETIRAcetam  (KEPPRA ) 500 MG tablet Take 1 tablet (500 mg total) by mouth 2 (two) times daily. 05/17/23 06/16/23  Oral Billings, MD  LORazepam  (ATIVAN ) 1 MG tablet Take 1 tablet (1 mg total) by mouth every 8 (eight) hours as needed for anxiety or seizure. 01/29/22   Mozell Arias, MD  mirtazapine (REMERON) 30 MG tablet Take 30 mg by mouth at bedtime.    [provider]  prazosin (MINIPRESS) 1 MG capsule Take 2 mg by mouth at bedtime.    [provider]  Psyllium (METAMUCIL PO) Take by mouth See admin instructions. Sugar-free formula: Mix 1 tablespoonful of powder into 8 ounces of water or juice and drink twice a day as tolerated- as needed for constipation    [provider]     Physical Exam: Vitals:   06/09/24 2019 06/09/24 2025 06/09/24 2300  BP:  120/87 (!) 142/92  Pulse:  67 82  Resp:  15 18  Temp:  97.8  F (36.6 C)   TempSrc:  Oral   SpO2:  100% 100%  Weight: 89 kg    Height: 6\' 3"  (1.905 m)      Physical Exam Vitals and nursing note reviewed.  Constitutional:      Appearance: He is well-developed. He is ill-appearing.  Cardiovascular:     Rate and Rhythm: Regular rhythm. Tachycardia present.  Abdominal:     General: There is no distension.     Palpations: Abdomen is soft. There is no hepatomegaly, splenomegaly or mass.     Tenderness: There is abdominal tenderness in the epigastric area. There is no guarding or rebound. Negative signs include Murphy's sign.     Hernia: There is no hernia in the umbilical area or ventral area.  Skin:    General: Skin is dry.     Capillary Refill: Capillary refill takes less than 2 seconds.  Neurological:     Mental Status: He is alert and oriented to person, place, and time.  Psychiatric:        Mood and Affect: Mood normal.      Labs on Admission: I have personally reviewed following labs and imaging studies  CBC: Recent Labs  Lab 06/09/24 2058 06/09/24 2106  WBC 8.2  --   NEUTROABS 6.6  --   HGB 15.3 16.7  HCT 42.8 49.0  MCV 93.2  --   PLT 93*  --    Basic Metabolic Panel: Recent Labs  Lab 06/09/24 2058 06/09/24 2106  NA 132* 131*  K 3.6 3.4*  CL 84* 85*  CO2 26  --   GLUCOSE 84 87  BUN 18 21*  CREATININE 1.48* 1.10  CALCIUM  9.4  --   MG 1.3*  --    GFR: Estimated Creatinine Clearance: 105.6 mL/min (by C-G formula based on SCr of 1.1 mg/dL). Liver Function Tests: Recent Labs  Lab 06/09/24 2058  AST 522*  ALT 215*  ALKPHOS 232*  BILITOT 4.6*  PROT 7.8  ALBUMIN 4.3   Recent Labs  Lab 06/09/24 2058  LIPASE 151*  No results for input(s): "AMMONIA" in the last 168 hours. Coagulation Profile: No results for input(s): "INR", "PROTIME" in the last 168 hours. Cardiac Enzymes: Recent Labs  Lab 06/09/24 2058 06/09/24 2300  CKTOTAL 144  --   TROPONINIHS 5 6   BNP (last 3 results) No results for input(s):  "BNP" in the last 8760 hours. HbA1C: No results for input(s): "HGBA1C" in the last 72 hours. CBG: No results for input(s): "GLUCAP" in the last 168 hours. Lipid Profile: No results for input(s): "CHOL", "HDL", "LDLCALC", "TRIG", "CHOLHDL", "LDLDIRECT" in the last 72 hours. Thyroid Function Tests: No results for input(s): "TSH", "T4TOTAL", "FREET4", "T3FREE", "THYROIDAB" in the last 72 hours. Anemia Panel: No results for input(s): "VITAMINB12", "FOLATE", "FERRITIN", "TIBC", "IRON", "RETICCTPCT" in the last 72 hours. Urine analysis: No results found for: "COLORURINE", "APPEARANCEUR", "LABSPEC", "PHURINE", "GLUCOSEU", "HGBUR", "BILIRUBINUR", "KETONESUR", "PROTEINUR", "UROBILINOGEN", "NITRITE", "LEUKOCYTESUR"  Radiological Exams on Admission: I have personally reviewed images CT Angio Chest/Abd/Pel for Dissection W and/or Wo Contrast Result Date: 06/09/2024 CLINICAL DATA:  Aortic aneurysm suspected CP/ ABD pain into back. Nausea, vomiting. Chest pain. EXAM: CT ANGIOGRAPHY CHEST, ABDOMEN AND PELVIS TECHNIQUE: Non-contrast CT of the chest was initially obtained. Multidetector CT imaging through the chest, abdomen and pelvis was performed using the standard protocol during bolus administration of intravenous contrast. Multiplanar reconstructed images and MIPs were obtained and reviewed to evaluate the vascular anatomy. RADIATION DOSE REDUCTION: This exam was performed according to the departmental dose-optimization program which includes automated exposure control, adjustment of the mA and/or kV according to patient size and/or use of iterative reconstruction technique. CONTRAST:  100mL OMNIPAQUE IOHEXOL 350 MG/ML SOLN COMPARISON:  None Available. FINDINGS: CTA CHEST FINDINGS Cardiovascular: Heart is normal size. Aorta is normal caliber. No aortic dissection. No filling defects in the pulmonary arteries to suggest pulmonary emboli. Mediastinum/Nodes: No mediastinal, hilar, or axillary adenopathy. Trachea  and esophagus are unremarkable. Thyroid unremarkable. Lungs/Pleura: Lungs are clear. No focal airspace opacities or suspicious nodules. No effusions. Musculoskeletal: Chest wall soft tissues are unremarkable. No acute bony abnormality. Prior vertebroplasty from T11-L1. Review of the MIP images confirms the above findings. CTA ABDOMEN AND PELVIS FINDINGS VASCULAR Aorta: Normal caliber aorta without aneurysm, dissection, vasculitis or significant stenosis. Celiac: Patent without evidence of aneurysm, dissection, vasculitis or significant stenosis. SMA: Patent without evidence of aneurysm, dissection, vasculitis or significant stenosis. Renals: Both renal arteries are patent without evidence of aneurysm, dissection, vasculitis, fibromuscular dysplasia or significant stenosis. IMA: Patent without evidence of aneurysm, dissection, vasculitis or significant stenosis. Inflow: Patent without evidence of aneurysm, dissection, vasculitis or significant stenosis. Veins: No obvious venous abnormality within the limitations of this arterial phase study. Review of the MIP images confirms the above findings. NON-VASCULAR Hepatobiliary: Diffuse fatty infiltration of the liver. Gallbladder unremarkable. No focal hepatic abnormality. Pancreas: No focal abnormality or ductal dilatation. Spleen: No focal abnormality.  Normal size. Adrenals/Urinary Tract: No adrenal abnormality. No focal renal abnormality. No stones or hydronephrosis. Urinary bladder is unremarkable. Stomach/Bowel: Stomach, large and small bowel grossly unremarkable. Normal appendix. Lymphatic: No adenopathy Reproductive: No visible focal abnormality. Other: No free fluid or free air. Musculoskeletal: No acute bony abnormality. Review of the MIP images confirms the above findings. IMPRESSION: No evidence of aortic aneurysm or dissection. No evidence of pulmonary embolus. No acute findings in the chest, abdomen or pelvis. Hepatic steatosis. Electronically Signed   By:  Janeece Mechanic M.D.   On: 06/09/2024 21:38     EKG: My personal interpretation of EKG shows: EKG showed  normal sinus rhythm heart rate 77, prolonged QTc 581 ms and atypical right bundle branch block.    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 - At presentation to ED patient is hemodynamically stable. - CBC no evidence of leukocytosis except for platelet count 93. -CMP showing elevated AST 522, elevated ALT 212, ALP 232 and bilirubin 4.6.  Elevated lipase 152.  Patient has chronic transaminitis and hyperbilirubinemia in the setting of chronic alcoholic cirrhosis and fatty liver as well. - CT abdomen pelvis no evidence of acute chest, abdominal pelvic finding. -Intractable nausea vomiting in the setting of alcohol withdrawal, underlying chronic transaminitis, and concern for early development of pancreatitis. - Admitting patient for management for nausea control, pain control and fluid management. - Checking acute hepatitis panel.  Continue to trend hepatic panel. - Starting full liquid diet will advance to regular diet as patient tolerates with nausea vomiting. - In ED patient received 1 L of LR bolus.  Continue maintenance fluid LR 125 cc/h. -Continue Tigan as  needed for nausea.  Continue Dilaudid as needed for moderate to severe pain control.   Noncardiac chest pain - Patient is also complaining about mid epigastric and mid substernal chest pain with patient to be back.  Normal troponin level.  EKG showing normal sinus rhythm and prolonged QTc.  There is no history of abnormality. CTA chest obtained which ruled out aortic aneurysm or dissection. No evidence of PE. - Noncardiac refer chest pain for midepigastric region from underlying transaminitis and chronic cirrhosis.   Prerenal acute kidney injury -Elevated creatinine 1.48.  Prerenal acute kidney injury in the setting of dehydration from nausea vomiting and poor oral intake. - Checking UA. - Continue maintenance fluid NS.  Monitor renal function.  Hyponatremia -Low sodium 132.  And low chloride 84.  Hyponatremia in the setting of poor solute intake and beer potomania from chronic alcohol use. - Continue monitor serum sodium level and starting NS 125 cc/h.   Hypomagnesemia -Low magnesium  1.3.  Repleted with IV KCl 2 g in the ED.  Elevated anion gap secondary to dehydration from starvation ketosis -Elevated anion gap 20.  Normal bicarb level.  High anion gap concern for underlying ketoacidosis from starvation ketosis.  Continue maintenance fluid.  Alcohol withdrawal History of chronic alcohol use - In the ED patient is diaphoretic and tachycardic.  Concern for development of alcohol withdrawal. - Continue CIWA protocol, Ativan  dapper as needed, multivitamin, folic acid  and and thiamine .  History of seizure due to alcohol withdrawal -Continue IV Keppra  500 mg twice daily.  Once nausea resolved can transition to oral Keppra   Prolonged Qtc -Need to avoid further QT prolonging medication.  Essential hypertension -At home patient presently not any antihypertensive regimen.  History of recurrent fall under influence of alcohol History of subdural hematoma - Will avoid antiplatelet and  anticoagulation.  PTSD Generalized anxiety disorder Adjustment disorder Reported not taking BuSpar . Pending medication reconciliation by pharmacy.    DVT prophylaxis:  SCDs and TED hose.  Deferring pharmacological DVT prophylaxis as history of subdural hematoma. Code Status:  Full Code Diet: Clear liquid diet Family Communication:   Family was present at bedside, at the time of interview.  Opportunity was given to ask question and all questions were answered satisfactorily.  Disposition Plan: Continue improvement of nausea vomiting.  Trend hepatic function panel and renal function. Consults:    Admission status:   Observation, Telemetry bed  Severity of Illness: The appropriate patient status for this patient is OBSERVATION. Observation status is judged to be reasonable and necessary in order to provide the required intensity of service to ensure the patient's safety. The patient's presenting symptoms, physical exam findings, and initial radiographic and laboratory data in the context of their medical condition is felt to place them at decreased risk for further clinical deterioration. Furthermore, it is anticipated that the patient will be medically stable for discharge from the hospital within 2 midnights of admission.     Dillon Bensinger, MD [facility]  How to contact the [facility] Attending or Consulting provider 7A - 7P or covering provider during after hours 7P -7A, for this patient.  Check the care team in [facility] and look for a) attending/consulting [facility] provider listed and b) the [facility] team listed Log into [email or web address] and use [facility] universal password to access. If you do not have the password, please contact the hospital operator. Locate the [facility] provider you are looking for under [facility] and page to a number that you can be directly reached. If you still have difficulty reaching the provider, please page the [facility] (Director on Call) for the Hospitalists listed on  amion for assistance.  06/10/2024, 12:02 AM

## 2024-06-09 NOTE — ED Triage Notes (Signed)
 Pt BIBEMS from home came to ED c/o abdominal pain with nausea and vomiting x 2-3 days,  mentioned of some chest pain

## 2024-06-09 NOTE — ED Provider Notes (Signed)
 [place] EMERGENCY DEPARTMENT AT [facility] Provider Note   CSN: [id number] Arrival date & time: 06/09/24  2017     History  Chief Complaint  Patient presents with   Abdominal Pain   Chest Pain   Nausea   Emesis    Dillon Gonzalez is a 43 y.o. male here for evaluation of nausea, vomiting, abdominal pain and chest pain.  Pain to center lower chest and upper abdomen goes into his back.  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  States he occasionally drinks alcohol 1-2 times a month.  Does not use NSAIDs.  Does occasionally take Tylenol.  No known specific enticing events.  Has been unable to keep down p.o. intake over the last 3 days.  Chest pain does not radiate to left arm or jaw.  No associated shortness of breath.  Has a mild nonproductive cough occasionally is had some blood tinged sputum after coughing hard.  Denies any prior history of esophageal varices.  He is not anticoagulated.  No dysuria, hematuria, bloody stool.  Had something similar previously many years ago and was prescribed Bentyl  Per patient- Liver specialist at [facility], supposed to start Rezdiffra for hepatic fibrosis and steatosis however has not started medication as of yet  HPI     Home Medications Prior to Admission medications   Medication Sig Start Date End Date Taking? Authorizing Provider  ACIDOPHILUS LACTOBACILLUS PO Take 1 tablet by mouth daily.    [provider]  albuterol  (VENTOLIN  HFA) 108 (90 Base) MCG/ACT inhaler Inhale 2 puffs into the lungs every 6 (six) hours as needed for wheezing or shortness of breath.    [provider]  amLODipine  (NORVASC ) 5 MG tablet Take 5 mg by mouth at bedtime.    [provider]  atenolol  (TENORMIN ) 50 MG tablet Take 1 tablet (50 mg total) by mouth daily. 05/18/23   Oral Billings, MD  busPIRone  (BUSPAR ) 10 MG tablet Take 5 mg  by mouth 2 (two) times daily as needed (for anxiety).    [provider]  Cholecalciferol  (VITAMIN D3) 50 MCG (2000 UT) TABS Take 2,000 Units by mouth daily.    [provider]  cyclobenzaprine  (FLEXERIL ) 10 MG tablet Take 5 mg by mouth at bedtime as needed for muscle spasms.    [provider]  diclofenac Sodium (VOLTAREN) 1 % GEL Apply 2 g topically 4 (four) times daily as needed (to painful sites).    [provider]  dicyclomine (BENTYL) 20 MG tablet Take 20 mg by mouth See admin instructions. Take 20 mg by mouth up to three times a day and at bedtime as needed for spasms    [provider]  esomeprazole (NEXIUM) 40 MG capsule Take 40 mg by mouth 2 (two) times daily before a meal.    [provider]  levETIRAcetam  (KEPPRA ) 500 MG tablet Take 1 tablet (500 mg total) by mouth 2 (two) times daily. 05/17/23 06/16/23  Oral Billings, MD  LORazepam  (ATIVAN ) 1 MG tablet Take 1 tablet (1 mg total) by mouth every 8 (eight) hours as needed for anxiety or seizure. 01/29/22   Mozell Arias, MD  mirtazapine (REMERON) 30 MG tablet Take 30 mg by mouth at bedtime.    [provider]  prazosin  (MINIPRESS ) 1 MG capsule Take 2 mg by mouth at bedtime.    [provider]  Psyllium (METAMUCIL  PO) Take by mouth See admin instructions. Sugar-free formula: Mix 1 tablespoonful of powder into 8 ounces of water or juice and drink twice a day as tolerated- as needed for constipation    [provider]      Allergies    Amlodipine , Atenolol -chlorthalidone, Hydroxyzine, Lisinopril, Losartan, Paroxetine, Sertraline, and Telmisartan    Review of Systems   Review of Systems  Constitutional: Negative.   HENT: Negative.    Respiratory:  Positive for cough. Negative for apnea, choking, chest tightness, shortness of breath, wheezing and stridor.   Cardiovascular:  Positive for chest pain. Negative for palpitations and leg swelling.   Gastrointestinal:  Positive for abdominal pain, nausea and vomiting. Negative for abdominal distention, anal bleeding, blood in stool, constipation and rectal pain.  Genitourinary: Negative.   Musculoskeletal:  Positive for back pain. Negative for arthralgias, gait problem, joint swelling, myalgias, neck pain and neck stiffness.  Skin: Negative.   Neurological: Negative.   All other systems reviewed and are negative.   Physical Exam Updated Vital Signs BP (!) 142/92   Pulse 82   Temp 97.8 F (36.6 C) (Oral)   Resp 18   Ht 6' 3 (1.905 m)   Wt 89 kg   SpO2 100%   BMI 24.52 kg/m  Physical Exam Vitals and nursing note reviewed.  Constitutional:      General: He is not in acute distress.    Appearance: He is well-developed. He is not ill-appearing, toxic-appearing or diaphoretic.  HENT:     Head: Normocephalic and atraumatic.  Eyes:     Pupils: Pupils are equal, round, and reactive to light.  Cardiovascular:     Rate and Rhythm: Normal rate and regular rhythm.     Heart sounds: Normal heart sounds.  Pulmonary:     Effort: Pulmonary effort is normal. No respiratory distress.     Breath sounds: Normal breath sounds.     Comments: Clear bilaterally, speaks in full sentences without difficulty Abdominal:     General: Bowel sounds are normal. There is no distension.     Palpations: Abdomen is soft.     Tenderness: There is generalized abdominal tenderness and tenderness in the epigastric area. There is no right CVA tenderness, left CVA tenderness, guarding or rebound. Negative signs include Murphy's sign.     Hernia: No hernia is present.     Comments: Generalized tenderness, worse to epigastric and upper abdomen.  No rebound or guarding.  Negative Murphy sign  Musculoskeletal:        General: Normal range of motion.     Cervical back: Normal range of motion and neck supple.  Skin:    General: Skin is warm and dry.     Capillary Refill: Capillary refill takes less than 2  seconds.  Neurological:     General: No focal deficit present.     Mental Status: He is alert and oriented to person, place, and time.     ED Results / Procedures / Treatments   Labs (all labs ordered are listed, but only abnormal results are displayed) Labs Reviewed  CBC WITH DIFFERENTIAL/PLATELET - Abnormal; Notable for the following components:      Result Value   Platelets 93 (*)    Abs Immature Granulocytes 0.15 (*)    All other components within normal limits  COMPREHENSIVE METABOLIC PANEL WITH GFR - Abnormal; Notable for the following components:   Sodium 132 (*)    Chloride 84 (*)    Creatinine, Ser 1.48 (*)  AST 522 (*)    ALT 215 (*)    Alkaline Phosphatase 232 (*)    Total Bilirubin 4.6 (*)    Anion gap 22 (*)    All other components within normal limits  LIPASE, BLOOD - Abnormal; Notable for the following components:   Lipase 151 (*)    All other components within normal limits  MAGNESIUM  - Abnormal; Notable for the following components:   Magnesium  1.3 (*)    All other components within normal limits  I-STAT CHEM 8, ED - Abnormal; Notable for the following components:   Sodium 131 (*)    Potassium 3.4 (*)    Chloride 85 (*)    BUN 21 (*)    Calcium , Ion 0.97 (*)    All other components within normal limits  CK  ETHANOL  TROPONIN I (HIGH SENSITIVITY)  TROPONIN I (HIGH SENSITIVITY)    EKG EKG Interpretation Date/Time:  Tuesday June 09 2024 20:25:14 EDT Ventricular Rate:  77 PR Interval:  160 QRS Duration:  122 QT Interval:  513 QTC Calculation: 581 R Axis:   -4  Text Interpretation: Sinus rhythm IVCD, consider atypical RBBB Confirmed by Mozell Arias [phone]) on 06/09/2024 10:28:57 PM  Radiology CT Angio Chest/Abd/Pel for Dissection W and/or Wo Contrast Result Date: 06/09/2024 CLINICAL DATA:  Aortic aneurysm suspected CP/ ABD pain into back. Nausea, vomiting. Chest pain. EXAM: CT ANGIOGRAPHY CHEST, ABDOMEN AND PELVIS TECHNIQUE: Non-contrast CT  of the chest was initially obtained. Multidetector CT imaging through the chest, abdomen and pelvis was performed using the standard protocol during bolus administration of intravenous contrast. Multiplanar reconstructed images and MIPs were obtained and reviewed to evaluate the vascular anatomy. RADIATION DOSE REDUCTION: This exam was performed according to the departmental dose-optimization program which includes automated exposure control, adjustment of the mA and/or kV according to patient size and/or use of iterative reconstruction technique. CONTRAST:  OMNIPAQUE  IOHEXOL  350 MG/ML SOLN COMPARISON:  None Available. FINDINGS: CTA CHEST FINDINGS Cardiovascular: Heart is normal size. Aorta is normal caliber. No aortic dissection. No filling defects in the pulmonary arteries to suggest pulmonary emboli. Mediastinum/Nodes: No mediastinal, hilar, or axillary adenopathy. Trachea and esophagus are unremarkable. Thyroid unremarkable. Lungs/Pleura: Lungs are clear. No focal airspace opacities or suspicious nodules. No effusions. Musculoskeletal: Chest wall soft tissues are unremarkable. No acute bony abnormality. Prior vertebroplasty from T11-L1. Review of the MIP images confirms the above findings. CTA ABDOMEN AND PELVIS FINDINGS VASCULAR Aorta: Normal caliber aorta without aneurysm, dissection, vasculitis or significant stenosis. Celiac: Patent without evidence of aneurysm, dissection, vasculitis or significant stenosis. SMA: Patent without evidence of aneurysm, dissection, vasculitis or significant stenosis. Renals: Both renal arteries are patent without evidence of aneurysm, dissection, vasculitis, fibromuscular dysplasia or significant stenosis. IMA: Patent without evidence of aneurysm, dissection, vasculitis or significant stenosis. Inflow: Patent without evidence of aneurysm, dissection, vasculitis or significant stenosis. Veins: No obvious venous abnormality within the limitations of this arterial phase  study. Review of the MIP images confirms the above findings. NON-VASCULAR Hepatobiliary: Diffuse fatty infiltration of the liver. Gallbladder unremarkable. No focal hepatic abnormality. Pancreas: No focal abnormality or ductal dilatation. Spleen: No focal abnormality.  Normal size. Adrenals/Urinary Tract: No adrenal abnormality. No focal renal abnormality. No stones or hydronephrosis. Urinary bladder is unremarkable. Stomach/Bowel: Stomach, large and small bowel grossly unremarkable. Normal appendix. Lymphatic: No adenopathy Reproductive: No visible focal abnormality. Other: No free fluid or free air. Musculoskeletal: No acute bony abnormality. Review of the MIP images confirms the above findings. IMPRESSION: No evidence  of aortic aneurysm or dissection. No evidence of pulmonary embolus. No acute findings in the chest, abdomen or pelvis. Hepatic steatosis. Electronically Signed   By: Janeece Mechanic M.D.   On: 06/09/2024 21:38   Procedures Procedures    Medications Ordered in ED Medications  lactated ringers  infusion (has no administration in time range)  sodium chloride  0.9 % bolus 1,000 mL (0 mLs Intravenous Stopped 06/09/24 2234)  ondansetron  (ZOFRAN ) injection 4 mg (4 mg Intravenous Given 06/09/24 2116)  morphine  (PF) 4 MG/ML injection 4 mg (4 mg Intravenous Given 06/09/24 2116)  iohexol  (OMNIPAQUE ) 350 MG/ML injection 100 mL (100 mLs Intravenous Contrast Given 06/09/24 2129)  magnesium  sulfate IVPB 2 g 50 mL (0 g Intravenous Stopped 06/09/24 2307)  sodium chloride  0.9 % bolus 1,000 mL (1,000 mLs Intravenous New Bag/Given 06/09/24 2249)  LORazepam  (ATIVAN ) injection 1 mg (1 mg Intravenous Given 06/09/24 2249)   ED Course/ Medical Decision Making/ A&P Clinical Course as of 06/09/24 2326  Tue Jun 09, 2024  2323 Dr. Sundil with medicine for admission [BH]    Clinical Course User Index [BH] Shaquoia Miers A, PA-C   43 year old known history of hepatic fibrosis here for evaluation of abdominal pain,  chest pain, nausea and vomiting over the last 3 days.  Unable to keep down p.o. meds.  Arrival he is afebrile, nonseptic, non-ill-appearing.  He is neurovascularly intact.  He has some diffuse tenderness to his upper abdomen.  He has no clinical evidence of VTE.  Labs and imaging personally reviewed and interpreted:  CBC without leukocytosis, platelets at 93 Troponin 5 Metabolic panel sodium 132, LFT elevation, alk phos 232 similar to prior, T. bili 4.6 anion gap 22 Lipase 151 Magnesium  1.3 CTA dissection study without acute abnormality Troponin 5 EKG without ischemic changes  Persistent dry heaving and pain.  EKG shows QTc 580s.  His magnesium  is low.  He did not initially get a dose of Zofran  however still having intractable nausea and vomiting.  Will give Ativan  hold off on prolonged QT medications currently.  He is getting magnesium  supplementation.  On repeat assessment we did discuss his increase in his LFTs up from when he saw [facility] a few months ago.  He now does state that he has had a few drinks over the last few days.  Unclear if this is worsening of his known hepatic fibrosis versus acute etiology.  CT scan does not show any evidence of gallbladder pathology.  His lipase is elevated at 151, question if he has developing pancreatitis.  He also mentions now that he was out in the yard doing a lot of yard work on Saturday.  Will add on CK.  Either way will need admission for AKI, intractable nausea and vomiting and trending of his LFTs.  Platelets are also low at 93.  CK 144  CONSULT with Dr. Sendil with medicine who is agreeable to evaluate patient for admission  The patient appears reasonably stabilized for admission considering the current resources, flow, and capabilities available in the ED at this time, and I doubt any other [facility] requiring further screening and/or treatment in the ED prior to admission.                                   Medical Decision Making Amount  and/or Complexity of Data Reviewed Independent Historian: EMS External Data Reviewed: labs, radiology, ECG and notes. Labs: ordered. Decision-making details documented  in ED Course. Radiology: ordered and independent interpretation performed. Decision-making details documented in ED Course. ECG/medicine tests: ordered and independent interpretation performed. Decision-making details documented in ED Course.  Risk OTC drugs. Prescription drug management. Parenteral controlled substances. Decision regarding hospitalization. Diagnosis or treatment significantly limited by social determinants of health.          Final Clinical Impression(s) / ED Diagnoses Final diagnoses:  Epigastric pain  Elevated lipase  Elevated LFTs  Long QT interval  Nausea and vomiting, unspecified vomiting type  Thrombocytopenia [facility])    Rx / DC Orders ED Discharge Orders     None         Karis Emig A, PA-C 06/09/24 2326    Mozell Arias, MD 06/12/24 [phone]

## 2024-06-10 DIAGNOSIS — G4733 Obstructive sleep apnea (adult) (pediatric): Secondary | ICD-10-CM | POA: Diagnosis present

## 2024-06-10 DIAGNOSIS — F411 Generalized anxiety disorder: Secondary | ICD-10-CM | POA: Diagnosis present

## 2024-06-10 DIAGNOSIS — F4312 Post-traumatic stress disorder, chronic: Secondary | ICD-10-CM | POA: Diagnosis present

## 2024-06-10 DIAGNOSIS — K852 Alcohol induced acute pancreatitis without necrosis or infection: Secondary | ICD-10-CM | POA: Diagnosis present

## 2024-06-10 DIAGNOSIS — F10139 Alcohol abuse with withdrawal, unspecified: Secondary | ICD-10-CM | POA: Diagnosis present

## 2024-06-10 DIAGNOSIS — I10 Essential (primary) hypertension: Secondary | ICD-10-CM | POA: Diagnosis present

## 2024-06-10 DIAGNOSIS — K7030 Alcoholic cirrhosis of liver without ascites: Secondary | ICD-10-CM | POA: Diagnosis present

## 2024-06-10 DIAGNOSIS — N179 Acute kidney failure, unspecified: Secondary | ICD-10-CM | POA: Diagnosis present

## 2024-06-10 DIAGNOSIS — E860 Dehydration: Secondary | ICD-10-CM | POA: Diagnosis present

## 2024-06-10 DIAGNOSIS — D693 Immune thrombocytopenic purpura: Secondary | ICD-10-CM | POA: Diagnosis present

## 2024-06-10 DIAGNOSIS — E871 Hypo-osmolality and hyponatremia: Secondary | ICD-10-CM | POA: Diagnosis present

## 2024-06-10 DIAGNOSIS — K760 Fatty (change of) liver, not elsewhere classified: Secondary | ICD-10-CM | POA: Diagnosis present

## 2024-06-10 DIAGNOSIS — G40909 Epilepsy, unspecified, not intractable, without status epilepticus: Secondary | ICD-10-CM | POA: Diagnosis present

## 2024-06-10 DIAGNOSIS — Z87891 Personal history of nicotine dependence: Secondary | ICD-10-CM | POA: Diagnosis not present

## 2024-06-10 DIAGNOSIS — I4510 Unspecified right bundle-branch block: Secondary | ICD-10-CM | POA: Diagnosis present

## 2024-06-10 DIAGNOSIS — R1013 Epigastric pain: Secondary | ICD-10-CM | POA: Diagnosis present

## 2024-06-10 DIAGNOSIS — Z79899 Other long term (current) drug therapy: Secondary | ICD-10-CM | POA: Diagnosis not present

## 2024-06-10 DIAGNOSIS — E876 Hypokalemia: Secondary | ICD-10-CM | POA: Diagnosis present

## 2024-06-10 DIAGNOSIS — K859 Acute pancreatitis without necrosis or infection, unspecified: Secondary | ICD-10-CM | POA: Diagnosis present

## 2024-06-10 LAB — CBC
HCT: 37.7 % — ABNORMAL LOW (ref 39.0–52.0)
Hemoglobin: 12.9 g/dL — ABNORMAL LOW (ref 13.0–17.0)
MCH: 32.3 pg (ref 26.0–34.0)
MCHC: 34.2 g/dL (ref 30.0–36.0)
MCV: 94.5 fL (ref 80.0–100.0)
Platelets: 69 10*3/uL — ABNORMAL LOW (ref 150–400)
RBC: 3.99 MIL/uL — ABNORMAL LOW (ref 4.22–5.81)
RDW: 13.7 % (ref 11.5–15.5)
WBC: 7 10*3/uL (ref 4.0–10.5)
nRBC: 0 % (ref 0.0–0.2)

## 2024-06-10 LAB — HEPATITIS PANEL, ACUTE
HCV Ab: NONREACTIVE
Hep A IgM: NONREACTIVE
Hep B C IgM: NONREACTIVE
Hepatitis B Surface Ag: NONREACTIVE

## 2024-06-10 LAB — URINALYSIS, ROUTINE W REFLEX MICROSCOPIC
Bilirubin Urine: NEGATIVE
Glucose, UA: NEGATIVE mg/dL
Hgb urine dipstick: NEGATIVE
Ketones, ur: 5 mg/dL — AB
Leukocytes,Ua: NEGATIVE
Nitrite: NEGATIVE
Protein, ur: NEGATIVE mg/dL
Specific Gravity, Urine: 1.019 (ref 1.005–1.030)
pH: 7 (ref 5.0–8.0)

## 2024-06-10 LAB — COMPREHENSIVE METABOLIC PANEL WITH GFR
ALT: 174 U/L — ABNORMAL HIGH (ref 0–44)
AST: 358 U/L — ABNORMAL HIGH (ref 15–41)
Albumin: 3.3 g/dL — ABNORMAL LOW (ref 3.5–5.0)
Alkaline Phosphatase: 172 U/L — ABNORMAL HIGH (ref 38–126)
Anion gap: 18 — ABNORMAL HIGH (ref 5–15)
BUN: 14 mg/dL (ref 6–20)
CO2: 23 mmol/L (ref 22–32)
Calcium: 8 mg/dL — ABNORMAL LOW (ref 8.9–10.3)
Chloride: 93 mmol/L — ABNORMAL LOW (ref 98–111)
Creatinine, Ser: 0.99 mg/dL (ref 0.61–1.24)
GFR, Estimated: >60 mL/min (ref >60)
Glucose, Bld: 75 mg/dL (ref 70–99)
Potassium: 3.5 mmol/L (ref 3.5–5.1)
Sodium: 134 mmol/L — ABNORMAL LOW (ref 135–145)
Total Bilirubin: 3.5 mg/dL — ABNORMAL HIGH (ref 0.0–1.2)
Total Protein: 6.5 g/dL (ref 6.5–8.1)

## 2024-06-10 LAB — MAGNESIUM: Magnesium: 1.9 mg/dL (ref 1.7–2.4)

## 2024-06-10 LAB — HIV ANTIBODY (ROUTINE TESTING W REFLEX): HIV Screen 4th Generation wRfx: NONREACTIVE

## 2024-06-10 LAB — PROTIME-INR
INR: 1.2 (ref 0.8–1.2)
Prothrombin Time: 15.2 s (ref 11.4–15.2)

## 2024-06-10 LAB — LIPASE, BLOOD: Lipase: 124 U/L — ABNORMAL HIGH (ref 11–51)

## 2024-06-10 LAB — APTT: aPTT: 28 s (ref 24–36)

## 2024-06-10 MED ORDER — VITAMIN D 25 MCG (1000 UNIT) PO TABS
2000.0000 [IU] | ORAL_TABLET | Freq: Every day | ORAL | Status: DC
  Administered 2024-06-10 – 2024-06-11 (×2): 2000 [IU] via ORAL
  Filled 2024-06-10 (×2): qty 2

## 2024-06-10 MED ORDER — CYCLOBENZAPRINE HCL 5 MG PO TABS: 5.0000 mg | ORAL_TABLET | Freq: Every evening | ORAL | Status: DC | PRN

## 2024-06-10 MED ORDER — LEVETIRACETAM 500 MG PO TABS
500.0000 mg | ORAL_TABLET | Freq: Two times a day (BID) | ORAL | Status: DC
  Administered 2024-06-10: 500 mg via ORAL
  Filled 2024-06-10 (×2): qty 1

## 2024-06-10 MED ORDER — PANTOPRAZOLE SODIUM 40 MG PO TBEC
40.0000 mg | DELAYED_RELEASE_TABLET | Freq: Every day | ORAL | Status: DC
  Administered 2024-06-10 – 2024-06-11 (×2): 40 mg via ORAL
  Filled 2024-06-10 (×2): qty 1

## 2024-06-10 MED ORDER — FELODIPINE ER 2.5 MG PO TB24
2.5000 mg | ORAL_TABLET | Freq: Every day | ORAL | Status: DC
  Administered 2024-06-10: 2.5 mg via ORAL
  Filled 2024-06-10 (×2): qty 1

## 2024-06-10 MED ORDER — PRAZOSIN HCL 2 MG PO CAPS
2.0000 mg | ORAL_CAPSULE | Freq: Every day | ORAL | Status: DC
  Administered 2024-06-10: 2 mg via ORAL
  Filled 2024-06-10: qty 1

## 2024-06-10 MED ORDER — ATENOLOL 50 MG PO TABS
50.0000 mg | ORAL_TABLET | Freq: Every day | ORAL | Status: DC
  Administered 2024-06-10: 50 mg via ORAL
  Filled 2024-06-10: qty 1

## 2024-06-10 MED ORDER — ALBUTEROL SULFATE (2.5 MG/3ML) 0.083% IN NEBU: 3.0000 mL | INHALATION_SOLUTION | Freq: Four times a day (QID) | RESPIRATORY_TRACT | Status: DC | PRN

## 2024-06-10 MED ORDER — MELATONIN 3 MG PO TABS
12.0000 mg | ORAL_TABLET | Freq: Every day | ORAL | Status: DC
  Administered 2024-06-10: 12 mg via ORAL
  Filled 2024-06-10: qty 4

## 2024-06-10 NOTE — Progress Notes (Signed)
 PROGRESS NOTE    [name]  NWG:[id number] DOB: 1981/01/07 DOA: 06/09/2024 PCP: Lorelei Rogers, MD   Brief Narrative:  HPI:  Dillon Gonzalez is a 43 y.o. 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 Patient reported lower central chest pain abdominal pain that radiates to the back.States he occasionally drinks alcohol 1-2 times a month. Does not use NSAIDs. Does occasionally take Tylenol. No known specific enticing events. Has been unable to keep down p.o. intake over the last 3 days. Chest pain does not radiate to left arm or jaw. No associated shortness of breath. Has a mild nonproductive cough occasionally is had some blood tinged sputum after coughing hard. Denies any prior history of esophageal varices. He is not anticoagulated. No dysuria, hematuria, bloody stool.    Patient reported that before coming to the hospital 6-hour ago he had couple of drinks of drinks.  Reported persistent nausea, vomiting midepigastric abdominal pain.     ED Course:  At presentation to ED patient is hemodynamically stable. Troponin within normal range 5. EKG showed normal sinus rhythm heart rate 77, prolonged QTc 581 ms and atypical right bundle branch block.   CBC showing low platelet count 93 otherwise unremarkable. CMP showing low sodium 132, low chloride 84, elevated creatinine 1.48, chronic transaminitis, elevated anion gap 22. Elevated lipase level 151. Low back level 1.3. Normal CK level. Blood alcohol level in process.   CT angio chest abdomen pelvis no  acute aortic aneurysm, dissection, pulmonary embolism.  No acute chest, abdominal or pelvic finding.  Hepatic steatosis.   In the ED patient has been given Ativan , mag sulfate 2 g, morphine, Zofran 4 mg and 2 L of NS bolus. ED physician reported that patient now saying he is has been intermittently drinking alcohol.  However continues to have abdominal pain with associated nausea vomiting and poor oral intake.  Patient is also diaphoretic and tachycardic concern for alcohol withdrawal.   Hospitalist has been consulted for further evaluation management of intractable nausea vomiting, acute kidney injury in the setting of dehydration, hyponatremia, hypomagnesemia, concern for development of early pancreatitis and chronic hepatic cirrhosis.  Need to monitor for alcohol withdrawal as well.  Assessment & Plan:   Principal Problem:   AKI (acute kidney injury) (HCC) Active Problems:   Alcohol withdrawal (HCC)   Intractable nausea and vomiting   Hepatic cirrhosis (HCC)   Subdural hematoma (HCC)   Essential (primary) hypertension   Post-traumatic stress disorder, chronic   Prolonged QT interval   Hypomagnesemia   Transaminitis   History of seizure   Chronic idiopathic thrombocytopenia (HCC)   Chest pain  Acute alcoholic pancreatitis / Chronic transaminitis-secondary to hepatic cirrhosis/nausea and vomiting, POA:  elevated AST 522, elevated ALT 212, ALP 232 and bilirubin 4.6.  Elevated lipase 152 along with abdominal pain admits criteria for acute pancreatitis.  Thankfully CT abdomen is unremarkable for acute pathology.  Viral hepatitis panel pending.  LFTs improving.  Symptoms have improved.  Tolerating full liquid diet.  Will advance to soft diet now and then regular diet later as tolerated.  Does not have right upper quadrant tenderness.  Doubt acute cholecystitis or gallstones.  CT negative.  Noncardiac chest pain Does not have any chest pain today.  Most likely this was epigastric pain which  is resolved as well.  Cardiac enzymes negative without any acute ST-T wave changes.  Prerenal acute kidney injury Likely due to dehydration.  Creatinine 1.48 upon arrival, received IV fluids, down to 0.99 now.   Hypovolemic hyponatremia -Low sodium 132 upon arrival which is 134 today.  Monitor.  He is asymptomatic.   Hypomagnesemia -Low magnesium  1.3 upon arrival without EKG changes.  Pending magnesium  level today.   Elevated anion gap secondary to dehydration from starvation ketosis -Elevated anion gap 20.  Normal bicarb level.  High anion gap concern for underlying ketoacidosis from starvation ketosis.  Anion gap improving.  Continue IV fluids until tonight.   History of chronic alcohol abuse, POA: - In the ED patient is diaphoretic and tachycardic.  Concern for development of alcohol withdrawal.  Alcohol level was normal.  Patient does not appear to have any withdrawal symptoms.  CIWA has been under 3 and he has not required any as needed Ativan .   History of seizure due to alcohol withdrawal -Continue IV Keppra  500 mg twice daily.  Once nausea resolved can transition to oral Keppra    Prolonged Qtc - QTc 581.  Need to avoid further QT prolonging medication however need to continue Keppra .  Monitor on telemetry.  Repeat EKG today.   Essential hypertension -At home patient presently not any antihypertensive regimen.   History of recurrent fall under influence of alcohol History of subdural hematoma - Will avoid antiplatelet and anticoagulation.   PTSD Generalized anxiety disorder Adjustment disorder Reported not taking BuSpar . Pending medication reconciliation by pharmacy.  DVT prophylaxis: SCDs Start: 06/09/24 2332 Place TED hose Start: 06/09/24 2332   Code Status: Full Code  Family Communication:  None present at bedside.  Plan of care discussed with patient in length and he/she verbalized understanding and agreed with it.  Status is: Observation The patient will require  care spanning > 2 midnights and should be moved to inpatient because: Need to slowly advance diet.   Estimated body mass index is 24.52 kg/m as calculated from the following:   Height as of this encounter: 6' 3 (1.905 m).   Weight as of this encounter: 89 kg.    Nutritional Assessment: Body mass index is 24.52 kg/m.Aaron Aas Seen by dietician.  I agree with the assessment and plan as outlined below: Nutrition Status:        . Skin Assessment: I have examined the patient's skin and I agree with the wound assessment as performed by the wound care RN as outlined below:    Consultants:  None  Procedures:  None  Antimicrobials:  Anti-infectives (From admission, onward)    None         Subjective: Patient seen and examined.  He says that he feels better, no abdominal pain, nausea vomiting, tolerating full liquid diet.  Requesting to advance diet.  In fact he is eager to go home.  Objective: Vitals:   06/10/24 0048 06/10/24 0352 06/10/24 0600 06/10/24 0740  BP: (!) 126/95 114/60 127/83 (!) 135/95  Pulse: 81 72 76 86  Resp:  18  18  Temp: (!) 100.7 F (38.2 C) 97.7 F (36.5 C)  98.7 F (37.1 C)  TempSrc: Oral     SpO2: 100% 96%  97%  Weight:      Height:        Intake/Output Summary (Last 24 hours) at 06/10/2024 1013 Last data filed  at 06/10/2024 0854 Gross per 24 hour  Intake 2527.71 ml  Output --  Net 2527.71 ml   Filed Weights   06/09/24 2019  Weight: 89 kg    Examination:  General exam: Appears calm and comfortable  Respiratory system: Clear to auscultation. Respiratory effort normal. Cardiovascular system: S1 & S2 heard, RRR. No JVD, murmurs, rubs, gallops or clicks. No pedal edema. Gastrointestinal system: Abdomen is nondistended, soft and epigastric and left upper quadrant tenderness. No organomegaly or masses felt. Normal bowel sounds heard. Central nervous system: Alert and oriented. No focal neurological deficits. Extremities: Symmetric 5 x 5  power. Skin: No rashes, lesions or ulcers Psychiatry: Judgement and insight appear normal. Mood & affect appropriate.    Data Reviewed: I have personally reviewed following labs and imaging studies  CBC: Recent Labs  Lab 06/09/24 2058 06/09/24 2106 06/10/24 0414  WBC 8.2  --  7.0  NEUTROABS 6.6  --   --   HGB 15.3 16.7 12.9*  HCT 42.8 49.0 37.7*  MCV 93.2  --  94.5  PLT 93*  --  69*   Basic Metabolic Panel: Recent Labs  Lab 06/09/24 2058 06/09/24 2106 06/10/24 0414  NA 132* 131* 134*  K 3.6 3.4* 3.5  CL 84* 85* 93*  CO2 26  --  23  GLUCOSE 84 87 75  BUN 18 21* 14  CREATININE 1.48* 1.10 0.99  CALCIUM  9.4  --  8.0*  MG 1.3*  --   --    GFR: Estimated Creatinine Clearance: 117.4 mL/min (by C-G formula based on SCr of 0.99 mg/dL). Liver Function Tests: Recent Labs  Lab 06/09/24 2058 06/10/24 0414  AST 522* 358*  ALT 215* 174*  ALKPHOS 232* 172*  BILITOT 4.6* 3.5*  PROT 7.8 6.5  ALBUMIN 4.3 3.3*   Recent Labs  Lab 06/09/24 2058 06/10/24 0414  LIPASE 151* 124*   No results for input(s): AMMONIA in the last 168 hours. Coagulation Profile: Recent Labs  Lab 06/10/24 0414  INR 1.2   Cardiac Enzymes: Recent Labs  Lab 06/09/24 2058  CKTOTAL 144   BNP (last 3 results) No results for input(s): PROBNP in the last 8760 hours. HbA1C: No results for input(s): HGBA1C in the last 72 hours. CBG: No results for input(s): GLUCAP in the last 168 hours. Lipid Profile: No results for input(s): CHOL, HDL, LDLCALC, TRIG, CHOLHDL, LDLDIRECT in the last 72 hours. Thyroid Function Tests: No results for input(s): TSH, T4TOTAL, FREET4, T3FREE, THYROIDAB in the last 72 hours. Anemia Panel: No results for input(s): VITAMINB12, FOLATE, FERRITIN, TIBC, IRON, RETICCTPCT in the last 72 hours. Sepsis Labs: No results for input(s): PROCALCITON, LATICACIDVEN in the last 168 hours.  No results found for this or any previous visit  (from the past 240 hours).   Radiology Studies: CT Angio Chest/Abd/Pel for Dissection W and/or Wo Contrast Result Date: 06/09/2024 CLINICAL DATA:  Aortic aneurysm suspected CP/ ABD pain into back. Nausea, vomiting. Chest pain. EXAM: CT ANGIOGRAPHY CHEST, ABDOMEN AND PELVIS TECHNIQUE: Non-contrast CT of the chest was initially obtained. Multidetector CT imaging through the chest, abdomen and pelvis was performed using the standard protocol during bolus administration of intravenous contrast. Multiplanar reconstructed images and MIPs were obtained and reviewed to evaluate the vascular anatomy. RADIATION DOSE REDUCTION: This exam was performed according to the departmental dose-optimization program which includes automated exposure control, adjustment of the mA and/or kV according to patient size and/or use of iterative reconstruction technique. CONTRAST:  OMNIPAQUE IOHEXOL 350 MG/ML SOLN  COMPARISON:  None Available. FINDINGS: CTA CHEST FINDINGS Cardiovascular: Heart is normal size. Aorta is normal caliber. No aortic dissection. No filling defects in the pulmonary arteries to suggest pulmonary emboli. Mediastinum/Nodes: No mediastinal, hilar, or axillary adenopathy. Trachea and esophagus are unremarkable. Thyroid unremarkable. Lungs/Pleura: Lungs are clear. No focal airspace opacities or suspicious nodules. No effusions. Musculoskeletal: Chest wall soft tissues are unremarkable. No acute bony abnormality. Prior vertebroplasty from T11-L1. Review of the MIP images confirms the above findings. CTA ABDOMEN AND PELVIS FINDINGS VASCULAR Aorta: Normal caliber aorta without aneurysm, dissection, vasculitis or significant stenosis. Celiac: Patent without evidence of aneurysm, dissection, vasculitis or significant stenosis. SMA: Patent without evidence of aneurysm, dissection, vasculitis or significant stenosis. Renals: Both renal arteries are patent without evidence of aneurysm, dissection, vasculitis, fibromuscular  dysplasia or significant stenosis. IMA: Patent without evidence of aneurysm, dissection, vasculitis or significant stenosis. Inflow: Patent without evidence of aneurysm, dissection, vasculitis or significant stenosis. Veins: No obvious venous abnormality within the limitations of this arterial phase study. Review of the MIP images confirms the above findings. NON-VASCULAR Hepatobiliary: Diffuse fatty infiltration of the liver. Gallbladder unremarkable. No focal hepatic abnormality. Pancreas: No focal abnormality or ductal dilatation. Spleen: No focal abnormality.  Normal size. Adrenals/Urinary Tract: No adrenal abnormality. No focal renal abnormality. No stones or hydronephrosis. Urinary bladder is unremarkable. Stomach/Bowel: Stomach, large and small bowel grossly unremarkable. Normal appendix. Lymphatic: No adenopathy Reproductive: No visible focal abnormality. Other: No free fluid or free air. Musculoskeletal: No acute bony abnormality. Review of the MIP images confirms the above findings. IMPRESSION: No evidence of aortic aneurysm or dissection. No evidence of pulmonary embolus. No acute findings in the chest, abdomen or pelvis. Hepatic steatosis. Electronically Signed   By: Janeece Mechanic M.D.   On: 06/09/2024 21:38    Scheduled Meds:  atenolol   50 mg Oral QHS   felodipine  2.5 mg Oral Daily   folic acid   1 mg Oral Daily   levETIRAcetam   500 mg Intravenous Q12H   melatonin  12 mg Oral QHS   multivitamin with minerals  1 tablet Oral Daily   pantoprazole   40 mg Oral Daily   prazosin  2 mg Oral QHS   sodium chloride  flush  3 mL Intravenous Q12H   sodium chloride  flush  3 mL Intravenous Q12H   thiamine   100 mg Oral Daily   Or   thiamine   100 mg Intravenous Daily   Vitamin D3  2,000 Units Oral Daily   Continuous Infusions:  sodium chloride  125 mL/hr at 06/10/24 9604   sodium chloride        LOS: 0 days   Modena Andes, MD [facility]  06/10/2024, 10:13 AM   *Please note that this is  a verbal dictation therefore any spelling or grammatical errors are due to the Dragon Medical One system interpretation.  Please page via Amion and do not message via secure chat for urgent patient care matters. Secure chat can be used for non urgent patient care matters.  How to contact the [facility] Attending or Consulting provider 7A - 7P or covering provider during after hours 7P -7A, for this patient?  Check the care team in [facility] and look for a) attending/consulting [facility] provider listed and b) the [facility] team listed. Page or secure chat 7A-7P. Log into [email or web address] and use [facility]'s universal password to access. If you do not have the password, please contact the hospital operator. Locate the [facility] provider you are looking for under [facility] and page  to a number that you can be directly reached. If you still have difficulty reaching the provider, please page the [facility] (Director on Call) for the Hospitalists listed on amion for assistance.

## 2024-06-10 NOTE — Plan of Care (Signed)

## 2024-06-10 NOTE — Care Management (Signed)
 Encounter entered in [place] portal.  Confirmation number is [phone]

## 2024-06-11 ENCOUNTER — Encounter (HOSPITAL_COMMUNITY): Payer: Self-pay | Admitting: Family Medicine

## 2024-06-11 LAB — CBC WITH DIFFERENTIAL/PLATELET
Abs Immature Granulocytes: 0.08 10*3/uL — ABNORMAL HIGH (ref 0.00–0.07)
Basophils Absolute: 0.1 10*3/uL (ref 0.0–0.1)
Basophils Relative: 1 %
Eosinophils Absolute: 0.2 10*3/uL (ref 0.0–0.5)
Eosinophils Relative: 3 %
HCT: 33.7 % — ABNORMAL LOW (ref 39.0–52.0)
Hemoglobin: 11.8 g/dL — ABNORMAL LOW (ref 13.0–17.0)
Immature Granulocytes: 2 %
Lymphocytes Relative: 17 %
Lymphs Abs: 0.9 10*3/uL (ref 0.7–4.0)
MCH: 33.1 pg (ref 26.0–34.0)
MCHC: 35 g/dL (ref 30.0–36.0)
MCV: 94.4 fL (ref 80.0–100.0)
Monocytes Absolute: 0.5 10*3/uL (ref 0.1–1.0)
Monocytes Relative: 9 %
Neutro Abs: 3.4 10*3/uL (ref 1.7–7.7)
Neutrophils Relative %: 68 %
Platelets: 64 10*3/uL — ABNORMAL LOW (ref 150–400)
RBC: 3.57 MIL/uL — ABNORMAL LOW (ref 4.22–5.81)
RDW: 13.4 % (ref 11.5–15.5)
WBC: 5.1 10*3/uL (ref 4.0–10.5)
nRBC: 0 % (ref 0.0–0.2)

## 2024-06-11 LAB — COMPREHENSIVE METABOLIC PANEL WITH GFR
ALT: 146 U/L — ABNORMAL HIGH (ref 0–44)
AST: 236 U/L — ABNORMAL HIGH (ref 15–41)
Albumin: 3 g/dL — ABNORMAL LOW (ref 3.5–5.0)
Alkaline Phosphatase: 171 U/L — ABNORMAL HIGH (ref 38–126)
Anion gap: 9 (ref 5–15)
BUN: 6 mg/dL (ref 6–20)
CO2: 23 mmol/L (ref 22–32)
Calcium: 8 mg/dL — ABNORMAL LOW (ref 8.9–10.3)
Chloride: 103 mmol/L (ref 98–111)
Creatinine, Ser: 0.73 mg/dL (ref 0.61–1.24)
GFR, Estimated: >60 mL/min (ref >60)
Glucose, Bld: 102 mg/dL — ABNORMAL HIGH (ref 70–99)
Potassium: 3.3 mmol/L — ABNORMAL LOW (ref 3.5–5.1)
Sodium: 135 mmol/L (ref 135–145)
Total Bilirubin: 2.8 mg/dL — ABNORMAL HIGH (ref 0.0–1.2)
Total Protein: 5.8 g/dL — ABNORMAL LOW (ref 6.5–8.1)

## 2024-06-11 MED ORDER — POTASSIUM CHLORIDE CRYS ER 20 MEQ PO TBCR
40.0000 meq | EXTENDED_RELEASE_TABLET | ORAL | Status: DC
  Administered 2024-06-11: 40 meq via ORAL
  Filled 2024-06-11: qty 2

## 2024-06-11 NOTE — TOC Transition Note (Signed)
 Transition of Care [facility]) - Discharge Note   Patient Details  Name: Dillon Gonzalez MRN: [id number] Date of Birth: 1981-10-10  [facility] CM/SW Contact:  Danni Shima A [place], LCSW Phone Number: 06/11/2024, 9:56 AM   Clinical Narrative:     CW met with pt at bedside. Pt is from home, declined resources for hx of alcohol use, stated that was a long time ago and is not his primary issues now. 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 Discharge nurse notified TOC they can supply cab voucher to assist with transport home.   No other needs identified at this time. TOC will sign off, please consult again if TOC needs arise.    Final next level of care: Home/Self Care Barriers to Discharge: Barriers Resolved   Patient Goals and CMS Choice Patient states their goals for this hospitalization and ongoing recovery are:: Get follow up care at [facility]          Discharge Placement                  Name of family member notified: self, pt declined collateral contact Patient and family notified of of transfer: 06/11/24  Discharge Plan and Services Additional resources added to the After Visit Summary for                                       Social Drivers of Health (SDOH) Interventions SDOH Screenings   Food Insecurity: Food Insecurity Present (06/10/2024)  Housing: Low Risk  (06/10/2024)  Transportation Needs: Unmet Transportation Needs (06/10/2024)  Utilities: Not At Risk (06/10/2024)  Social Connections: Unknown (05/08/2022)   Received from [facility]  Tobacco Use: Medium Risk (06/11/2024)     Readmission Risk Interventions     No data to display

## 2024-06-11 NOTE — Discharge Summary (Signed)
 Physician Discharge Summary  Franklyn Cafaro VHQ:[id number] DOB: 05-12-1981 DOA: 06/09/2024  PCP: Lorelei Rogers, MD  Admit date: 06/09/2024 Discharge date: 06/11/2024 30 Day Unplanned Readmission Risk Score    Flowsheet Row ED to Hosp-Admission (Current) from 06/09/2024 in [facility]  30 Day Unplanned Readmission Risk Score (%) 14 Filed at 06/11/2024 0801    This score is the patient's risk of an unplanned readmission within 30 days of being discharged (0 -100%). The score is based on dignosis, age, lab data, medications, orders, and past utilization.   Low:  0-14.9   Medium: 15-21.9   High: 22-29.9   Extreme: 30 and above          Admitted From: Home Disposition: Home  Recommendations for Outpatient Follow-up:  Follow up with PCP in 1-2 weeks Please obtain BMP/CBC in one week Please follow up with your PCP on the following pending results: Unresulted Labs (From admission, onward)    None         Home Health: None Equipment/Devices: None  Discharge Condition: Stable CODE STATUS: Full code Diet recommendation: Cardiac  Subjective: Seen and examined, feels much better.  No abdominal pain or nausea.  Tolerating diet.  Eager to go home.  No signs of alcohol withdrawal.  Brief/Interim Summary: Dillon Gonzalez is a 43 y.o. 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 Does not use NSAIDs. Does occasionally take Tylenol. No known specific enticing events.  Has been unable to keep down p.o. intake over the last 3 days. Chest pain does not radiate to left arm or jaw. No associated shortness of breath. Has a mild nonproductive cough occasionally is had some blood tinged sputum after coughing hard. Denies any prior history of esophageal varices. He is not anticoagulated. No dysuria, hematuria, bloody stool.   Patient reported that before coming to the hospital 6-hour ago he had couple of drinks of drinks.  Reported persistent nausea, vomiting midepigastric abdominal pain. Upon arrival to ED, he was hemodynamically stable, EKG showed prolonged QTc of 581 but otherwise no acute ST-T wave changes. CBC showing low platelet count 93 otherwise unremarkable. CMP showing low sodium 132, low chloride 84, elevated creatinine 1.48, chronic transaminitis, elevated anion gap 22. Elevated lipase level 151.  Blood alcohol level less than 15. CT angio chest abdomen pelvis no acute aortic aneurysm, dissection, pulmonary embolism.  No acute chest, abdominal or pelvic finding.  Hepatic steatosis.  Patient was admitted for the following.   Acute alcoholic pancreatitis / Chronic transaminitis-secondary to hepatic cirrhosis/nausea and vomiting, POA:  elevated AST 522, elevated ALT 212, ALP 232 and bilirubin 4.6.  Elevated lipase 152 along with abdominal pain meets criteria for acute pancreatitis.  Thankfully CT abdomen is unremarkable for acute pathology.  Viral hepatitis panel negative.  LFTs improving.  Symptoms have improved.  Tolerating regular diet.  No abdominal pain or tenderness.   Noncardiac chest pain Does not have any chest pain today.  Most likely this was epigastric pain which is resolved as well.  Cardiac enzymes negative without any acute ST-T wave changes.   Prerenal acute kidney injury  Likely due to dehydration.  Creatinine 1.48 upon arrival, received IV fluids, down to normal now.   Hypovolemic hyponatremia -Low sodium 132 upon arrival which improved to normal  today.   Hypomagnesemia Resolved.  Hypokalemia: 3.2 today.  Will be replenished before discharge.   Elevated anion gap secondary to dehydration from starvation ketosis -Elevated anion gap 20.  Normal bicarb level.  High anion gap concern for underlying ketoacidosis from starvation ketosis.  Anion gap improved with IV fluids.   History of chronic alcohol abuse, POA: - In the ED patient is diaphoretic and tachycardic.  Concern for development of alcohol withdrawal.  Alcohol level was normal.  Patient does not appear to have any withdrawal symptoms.  CIWA has been under 3 and he has not required any as needed Ativan .  Extensive counseling about cutting back on the alcohol.  He verbalized understanding.   History of seizure due to alcohol withdrawal Apparently, he was started on Keppra  here.  However it does not appear that he was taking Keppra  prior to admission.  Prolonged Qtc - QTc 581.     Essential hypertension -At home patient presently not any antihypertensive regimen.   History of recurrent fall under influence of alcohol History of subdural hematoma - Will avoid antiplatelet and anticoagulation.   PTSD Generalized anxiety disorder Adjustment disorder Reported not taking BuSpar . Pending medication reconciliation by pharmacy.  Discharge plan was discussed with patient and/or family member and they verbalized understanding and agreed with it.  Discharge Diagnoses:  Principal Problem:   AKI (acute kidney injury) (HCC) Active Problems:   Alcohol withdrawal (HCC)   Intractable nausea and vomiting   Hepatic cirrhosis (HCC)   Subdural hematoma (HCC)   Essential (primary) hypertension   Post-traumatic stress disorder, chronic   Prolonged QT interval   Hypomagnesemia   Transaminitis   History of seizure   Chronic idiopathic thrombocytopenia (HCC)   Chest pain   Acute pancreatitis    Discharge Instructions   Allergies as of 06/11/2024       Reactions    Levetiracetam  Other (See Comments)   Anger related issues; bad reactions with other medications he was prescribed   Mirtazapine Other (See Comments)   Vertigo; elevated heart rate; severe nosebleeds   Atenolol -chlorthalidone Other (See Comments)   Tremors   Losartan Nausea Only, Swelling, Other (See Comments)   Upset stomach and some swelling   Telmisartan Swelling, Other (See Comments)   Feet became swollen   Amlodipine  Other (See Comments)   Lethargy and fatigue Tiredness- currently taking and tolerating   Hydroxyzine Nausea And Vomiting, Other (See Comments)   Dizziness also (after being on this for a few days, back-to-back)   Lisinopril Nausea And Vomiting, Other (See Comments)   GI Intolerance   Paroxetine Diarrhea   Sertraline Nausea And Vomiting, Other (See Comments)   GI Intolerance        Medication List     TAKE these medications    ACIDOPHILUS LACTOBACILLUS PO Take 1 tablet by mouth daily.   albuterol 108 (90 Base) MCG/ACT inhaler Commonly known as: VENTOLIN HFA Inhale 2 puffs into the lungs every 6 (six) hours as needed for wheezing or shortness of breath.   atenolol  50 MG tablet Commonly known as: TENORMIN  Take 1 tablet (50 mg total) by mouth daily. What changed: when to take this   chlorhexidine  0.12 % solution Commonly known as: PERIDEX  Take 15 mLs by mouth 2 (two) times daily.   cyclobenzaprine 10 MG tablet Commonly known as:  FLEXERIL Take 5 mg by mouth at bedtime as needed for muscle spasms.   dicyclomine 20 MG tablet Commonly known as: BENTYL Take 20 mg by mouth See admin instructions. Take 20 mg by mouth up to three times a day and at bedtime as needed for spasms   esomeprazole 40 MG capsule Commonly known as: NEXIUM Take 40 mg by mouth 2 (two) times daily before a meal.   felodipine 2.5 MG 24 hr tablet Commonly known as: PLENDIL Take 1 tablet by mouth daily.   hydrocortisone 1 % lotion Apply 1 Application topically daily as needed for  itching (skin irritations).   melatonin 3 MG Tabs tablet Take 12 mg by mouth at bedtime.   METAMUCIL PO Take by mouth See admin instructions. Sugar-free formula: Mix 1 tablespoonful of powder into 8 ounces of water or juice and drink twice a day as tolerated- as needed for constipation   prazosin 1 MG capsule Commonly known as: MINIPRESS Take 2-3 mg by mouth at bedtime.   Resmetirom 80 MG Tabs Take 1 tablet by mouth daily.   Vitamin D3 50 MCG (2000 UT) Tabs Take 2,000 Units by mouth daily.   Voltaren 1 % Gel Generic drug: diclofenac Sodium Apply 4 g topically 4 (four) times daily as needed (Joint pain).        Follow-up Information     Lorelei Rogers, MD Follow up in 1 week(s).   Specialty: Infectious Diseases Contact information: [address] [place]  [place] [address] [phone]                Allergies  Allergen Reactions   Levetiracetam  Other (See Comments)    Anger related issues; bad reactions with other medications he was prescribed   Mirtazapine Other (See Comments)    Vertigo; elevated heart rate; severe nosebleeds   Atenolol -Chlorthalidone Other (See Comments)    Tremors   Losartan Nausea Only, Swelling and Other (See Comments)    Upset stomach and some swelling   Telmisartan Swelling and Other (See Comments)    Feet became swollen   Amlodipine  Other (See Comments)    Lethargy and fatigue  Tiredness- currently taking and tolerating   Hydroxyzine Nausea And Vomiting and Other (See Comments)    Dizziness also (after being on this for a few days, back-to-back)   Lisinopril Nausea And Vomiting and Other (See Comments)    GI Intolerance   Paroxetine Diarrhea   Sertraline Nausea And Vomiting and Other (See Comments)    GI Intolerance    Consultations: None   Procedures/Studies: CT Angio Chest/Abd/Pel for Dissection W and/or Wo Contrast Result Date: 06/09/2024 CLINICAL DATA:  Aortic aneurysm suspected CP/ ABD pain into back. Nausea,  vomiting. Chest pain. EXAM: CT ANGIOGRAPHY CHEST, ABDOMEN AND PELVIS TECHNIQUE: Non-contrast CT of the chest was initially obtained. Multidetector CT imaging through the chest, abdomen and pelvis was performed using the standard protocol during bolus administration of intravenous contrast. Multiplanar reconstructed images and MIPs were obtained and reviewed to evaluate the vascular anatomy. RADIATION DOSE REDUCTION: This exam was performed according to the departmental dose-optimization program which includes automated exposure control, adjustment of the mA and/or kV according to patient size and/or use of iterative reconstruction technique. CONTRAST:  100mL OMNIPAQUE IOHEXOL 350 MG/ML SOLN COMPARISON:  None Available. FINDINGS: CTA CHEST FINDINGS Cardiovascular: Heart is normal size. Aorta is normal caliber. No aortic dissection. No filling defects in the pulmonary arteries to suggest pulmonary emboli. Mediastinum/Nodes: No mediastinal, hilar, or axillary adenopathy. Trachea and esophagus  are unremarkable. Thyroid unremarkable. Lungs/Pleura: Lungs are clear. No focal airspace opacities or suspicious nodules. No effusions. Musculoskeletal: Chest wall soft tissues are unremarkable. No acute bony abnormality. Prior vertebroplasty from T11-L1. Review of the MIP images confirms the above findings. CTA ABDOMEN AND PELVIS FINDINGS VASCULAR Aorta: Normal caliber aorta without aneurysm, dissection, vasculitis or significant stenosis. Celiac: Patent without evidence of aneurysm, dissection, vasculitis or significant stenosis. SMA: Patent without evidence of aneurysm, dissection, vasculitis or significant stenosis. Renals: Both renal arteries are patent without evidence of aneurysm, dissection, vasculitis, fibromuscular dysplasia or significant stenosis. IMA: Patent without evidence of aneurysm, dissection, vasculitis or significant stenosis. Inflow: Patent without evidence of aneurysm, dissection, vasculitis or significant  stenosis. Veins: No obvious venous abnormality within the limitations of this arterial phase study. Review of the MIP images confirms the above findings. NON-VASCULAR Hepatobiliary: Diffuse fatty infiltration of the liver. Gallbladder unremarkable. No focal hepatic abnormality. Pancreas: No focal abnormality or ductal dilatation. Spleen: No focal abnormality.  Normal size. Adrenals/Urinary Tract: No adrenal abnormality. No focal renal abnormality. No stones or hydronephrosis. Urinary bladder is unremarkable. Stomach/Bowel: Stomach, large and small bowel grossly unremarkable. Normal appendix. Lymphatic: No adenopathy Reproductive: No visible focal abnormality. Other: No free fluid or free air. Musculoskeletal: No acute bony abnormality. Review of the MIP images confirms the above findings. IMPRESSION: No evidence of aortic aneurysm or dissection. No evidence of pulmonary embolus. No acute findings in the chest, abdomen or pelvis. Hepatic steatosis. Electronically Signed   By: Janeece Mechanic M.D.   On: 06/09/2024 21:38     Discharge Exam: Vitals:   06/11/24 0359 06/11/24 0802  BP: 119/75 (!) 137/92  Pulse: 69 66  Resp: 16 15  Temp: 98.6 F (37 C) 98.1 F (36.7 C)  SpO2: 97% 99%   Vitals:   06/10/24 2155 06/10/24 2353 06/11/24 0359 06/11/24 0802  BP: (!) 143/98 123/75 119/75 (!) 137/92  Pulse: 90 65 69 66  Resp:  16 16 15   Temp:  98 F (36.7 C) 98.6 F (37 C) 98.1 F (36.7 C)  TempSrc:   Oral   SpO2:  98% 97% 99%  Weight:      Height:        General: Pt is alert, awake, not in acute distress Cardiovascular: RRR, S1/S2 +, no rubs, no gallops Respiratory: CTA bilaterally, no wheezing, no rhonchi Abdominal: Soft, NT, ND, bowel sounds + Extremities: no edema, no cyanosis    The results of significant diagnostics from this hospitalization (including imaging, microbiology, ancillary and laboratory) are listed below for reference.     Microbiology: No results found for this or any  previous visit (from the past 240 hours).   Labs: BNP (last 3 results) No results for input(s): BNP in the last 8760 hours. Basic Metabolic Panel: Recent Labs  Lab 06/09/24 2058 06/09/24 2106 06/10/24 0414 06/11/24 0433  NA 132* 131* 134* 135  K 3.6 3.4* 3.5 3.3*  CL 84* 85* 93* 103  CO2 26  --  23 23  GLUCOSE 84 87 75 102*  BUN 18 21* 14 6  CREATININE 1.48* 1.10 0.99 0.73  CALCIUM  9.4  --  8.0* 8.0*  MG 1.3*  --  1.9  --    Liver Function Tests: Recent Labs  Lab 06/09/24 2058 06/10/24 0414 06/11/24 0433  AST 522* 358* 236*  ALT 215* 174* 146*  ALKPHOS 232* 172* 171*  BILITOT 4.6* 3.5* 2.8*  PROT 7.8 6.5 5.8*  ALBUMIN 4.3 3.3* 3.0*   Recent Labs  Lab 06/09/24 2058 06/10/24 0414  LIPASE 151* 124*   No results for input(s): AMMONIA in the last 168 hours. CBC: Recent Labs  Lab 06/09/24 2058 06/09/24 2106 06/10/24 0414 06/11/24 0433  WBC 8.2  --  7.0 5.1  NEUTROABS 6.6  --   --  3.4  HGB 15.3 16.7 12.9* 11.8*  HCT 42.8 49.0 37.7* 33.7*  MCV 93.2  --  94.5 94.4  PLT 93*  --  69* 64*   Cardiac Enzymes: Recent Labs  Lab 06/09/24 2058  CKTOTAL 144   BNP: Invalid input(s): POCBNP CBG: No results for input(s): GLUCAP in the last 168 hours. D-Dimer No results for input(s): DDIMER in the last 72 hours. Hgb A1c No results for input(s): HGBA1C in the last 72 hours. Lipid Profile No results for input(s): CHOL, HDL, LDLCALC, TRIG, CHOLHDL, LDLDIRECT in the last 72 hours. Thyroid function studies No results for input(s): TSH, T4TOTAL, T3FREE, THYROIDAB in the last 72 hours.  Invalid input(s): FREET3 Anemia work up No results for input(s): VITAMINB12, FOLATE, FERRITIN, TIBC, IRON, RETICCTPCT in the last 72 hours. Urinalysis    Component Value Date/Time   COLORURINE AMBER (A) 06/10/2024 1009   APPEARANCEUR CLEAR 06/10/2024 1009   LABSPEC 1.019 06/10/2024 1009   PHURINE 7.0 06/10/2024 1009   GLUCOSEU  NEGATIVE 06/10/2024 1009   HGBUR NEGATIVE 06/10/2024 1009   BILIRUBINUR NEGATIVE 06/10/2024 1009   KETONESUR 5 (A) 06/10/2024 1009   PROTEINUR NEGATIVE 06/10/2024 1009   NITRITE NEGATIVE 06/10/2024 1009   LEUKOCYTESUR NEGATIVE 06/10/2024 1009   Sepsis Labs Recent Labs  Lab 06/09/24 2058 06/10/24 0414 06/11/24 0433  WBC 8.2 7.0 5.1   Microbiology No results found for this or any previous visit (from the past 240 hours).  FURTHER DISCHARGE INSTRUCTIONS:   Get Medicines reviewed and adjusted: Please take all your medications with you for your next visit with your Primary MD   Laboratory/radiological data: Please request your Primary MD to go over all hospital tests and procedure/radiological results at the follow up, please ask your Primary MD to get all Hospital records sent to his/her office.   In some cases, they will be blood work, cultures and biopsy results pending at the time of your discharge. Please request that your primary care M.D. goes through all the records of your hospital data and follows up on these results.   Also Note the following: If you experience worsening of your admission symptoms, develop shortness of breath, life threatening emergency, suicidal or homicidal thoughts you must seek medical attention immediately by calling 911 or calling your MD immediately  if symptoms less severe.   You must read complete instructions/literature along with all the possible adverse reactions/side effects for all the Medicines you take and that have been prescribed to you. Take any new Medicines after you have completely understood and accpet all the possible adverse reactions/side effects.    patient was instructed, not to drive, operate heavy machinery, perform activities at heights, swimming or participation in water activities or provide baby-sitting services while on Pain, Sleep and Anxiety Medications; until their outpatient Physician has advised to do so again. Also  recommended to not to take more than prescribed Pain, Sleep and Anxiety Medications.  It is not advisable to combine anxiety, sleep and pain medications without talking with your primary care provider.     Wear Seat belts while driving.   Please note: You were cared for by a hospitalist during your hospital stay. Once you are discharged, your  primary care physician will handle any further medical issues. Please note that NO REFILLS for any discharge medications will be authorized once you are discharged, as it is imperative that you return to your primary care physician (or establish a relationship with a primary care physician if you do not have one) for your post hospital discharge needs so that they can reassess your need for medications and monitor your lab values  Time coordinating discharge: Over 30 minutes  SIGNED:   Modena Andes, MD  [facility] 06/11/2024, 9:16 AM *Please note that this is a verbal dictation therefore any spelling or grammatical errors are due to the Dragon [employer] One system interpretation. If 7PM-7AM, please contact night-coverage [email or web address]

## 2024-08-31 DEATH — deceased
